# Patient Record
Sex: Female | Born: 1960 | Race: White | Hispanic: No | Marital: Married | Smoking: Never smoker
Health system: Southern US, Community
[De-identification: ages and names within clinical notes are randomized; demographics above are authoritative.]

## PROBLEM LIST (undated history)

## (undated) DIAGNOSIS — H409 Unspecified glaucoma: Secondary | ICD-10-CM

## (undated) DIAGNOSIS — I493 Ventricular premature depolarization: Secondary | ICD-10-CM

## (undated) HISTORY — DX: Ventricular premature depolarization: I49.3

## (undated) HISTORY — DX: Unspecified glaucoma: H40.9

---

## 1997-02-01 HISTORY — PX: OLECRANON BURSA EXCISION: SUR541

## 1997-02-01 HISTORY — PX: ELBOW SURGERY: SHX618

## 1997-05-22 ENCOUNTER — Emergency Department (HOSPITAL_COMMUNITY): Admission: EM | Admit: 1997-05-22 | Discharge: 1997-05-22 | Payer: Self-pay | Admitting: Emergency Medicine

## 1997-10-09 ENCOUNTER — Ambulatory Visit (HOSPITAL_COMMUNITY): Admission: RE | Admit: 1997-10-09 | Discharge: 1997-10-09 | Payer: Self-pay | Admitting: Orthopedic Surgery

## 1998-02-12 ENCOUNTER — Ambulatory Visit (HOSPITAL_COMMUNITY): Admission: RE | Admit: 1998-02-12 | Discharge: 1998-02-12 | Payer: Self-pay | Admitting: Obstetrics and Gynecology

## 1998-02-12 ENCOUNTER — Encounter: Payer: Self-pay | Admitting: Obstetrics and Gynecology

## 1998-12-22 ENCOUNTER — Other Ambulatory Visit: Admission: RE | Admit: 1998-12-22 | Discharge: 1998-12-22 | Payer: Self-pay | Admitting: Gynecology

## 1999-12-31 ENCOUNTER — Other Ambulatory Visit: Admission: RE | Admit: 1999-12-31 | Discharge: 1999-12-31 | Payer: Self-pay | Admitting: Gynecology

## 2000-12-13 ENCOUNTER — Encounter: Admission: RE | Admit: 2000-12-13 | Discharge: 2000-12-13 | Payer: Self-pay | Admitting: Sports Medicine

## 2001-01-04 ENCOUNTER — Other Ambulatory Visit: Admission: RE | Admit: 2001-01-04 | Discharge: 2001-01-04 | Payer: Self-pay | Admitting: Gynecology

## 2001-01-20 ENCOUNTER — Encounter: Payer: Self-pay | Admitting: Sports Medicine

## 2001-01-20 ENCOUNTER — Encounter: Admission: RE | Admit: 2001-01-20 | Discharge: 2001-01-20 | Payer: Self-pay | Admitting: Sports Medicine

## 2001-01-31 ENCOUNTER — Encounter: Payer: Self-pay | Admitting: Sports Medicine

## 2001-01-31 ENCOUNTER — Encounter: Admission: RE | Admit: 2001-01-31 | Discharge: 2001-01-31 | Payer: Self-pay | Admitting: Sports Medicine

## 2001-02-07 ENCOUNTER — Encounter: Admission: RE | Admit: 2001-02-07 | Discharge: 2001-02-07 | Payer: Self-pay | Admitting: Sports Medicine

## 2001-09-26 ENCOUNTER — Encounter: Admission: RE | Admit: 2001-09-26 | Discharge: 2001-09-26 | Payer: Self-pay | Admitting: Family Medicine

## 2001-11-23 ENCOUNTER — Encounter: Admission: RE | Admit: 2001-11-23 | Discharge: 2001-11-23 | Payer: Self-pay | Admitting: Sports Medicine

## 2001-12-01 ENCOUNTER — Encounter: Admission: RE | Admit: 2001-12-01 | Discharge: 2001-12-01 | Payer: Self-pay | Admitting: Family Medicine

## 2002-01-04 ENCOUNTER — Other Ambulatory Visit: Admission: RE | Admit: 2002-01-04 | Discharge: 2002-01-04 | Payer: Self-pay | Admitting: *Deleted

## 2002-01-05 ENCOUNTER — Encounter: Admission: RE | Admit: 2002-01-05 | Discharge: 2002-01-05 | Payer: Self-pay | Admitting: Family Medicine

## 2002-02-02 ENCOUNTER — Encounter: Admission: RE | Admit: 2002-02-02 | Discharge: 2002-02-02 | Payer: Self-pay | Admitting: Sports Medicine

## 2002-06-15 ENCOUNTER — Encounter: Admission: RE | Admit: 2002-06-15 | Discharge: 2002-06-15 | Payer: Self-pay | Admitting: Family Medicine

## 2003-01-09 ENCOUNTER — Other Ambulatory Visit: Admission: RE | Admit: 2003-01-09 | Discharge: 2003-01-09 | Payer: Self-pay | Admitting: Gynecology

## 2003-02-22 ENCOUNTER — Ambulatory Visit (HOSPITAL_COMMUNITY): Admission: RE | Admit: 2003-02-22 | Discharge: 2003-02-22 | Payer: Self-pay | Admitting: Gynecology

## 2004-01-23 ENCOUNTER — Other Ambulatory Visit: Admission: RE | Admit: 2004-01-23 | Discharge: 2004-01-23 | Payer: Self-pay | Admitting: Gynecology

## 2004-02-20 ENCOUNTER — Ambulatory Visit (HOSPITAL_COMMUNITY): Admission: RE | Admit: 2004-02-20 | Discharge: 2004-02-20 | Payer: Self-pay | Admitting: Gynecology

## 2004-09-25 ENCOUNTER — Ambulatory Visit: Payer: Self-pay | Admitting: Sports Medicine

## 2005-02-04 ENCOUNTER — Other Ambulatory Visit: Admission: RE | Admit: 2005-02-04 | Discharge: 2005-02-04 | Payer: Self-pay | Admitting: Gynecology

## 2005-02-12 ENCOUNTER — Ambulatory Visit: Payer: Self-pay | Admitting: Sports Medicine

## 2005-02-25 ENCOUNTER — Ambulatory Visit (HOSPITAL_COMMUNITY): Admission: RE | Admit: 2005-02-25 | Discharge: 2005-02-25 | Payer: Self-pay | Admitting: Gynecology

## 2006-03-07 ENCOUNTER — Other Ambulatory Visit: Admission: RE | Admit: 2006-03-07 | Discharge: 2006-03-07 | Payer: Self-pay | Admitting: Gynecology

## 2006-04-12 ENCOUNTER — Ambulatory Visit (HOSPITAL_COMMUNITY): Admission: RE | Admit: 2006-04-12 | Discharge: 2006-04-12 | Payer: Self-pay | Admitting: Gynecology

## 2007-03-13 ENCOUNTER — Other Ambulatory Visit: Admission: RE | Admit: 2007-03-13 | Discharge: 2007-03-13 | Payer: Self-pay | Admitting: Gynecology

## 2007-04-11 ENCOUNTER — Ambulatory Visit: Payer: Self-pay | Admitting: Sports Medicine

## 2007-04-11 DIAGNOSIS — M7742 Metatarsalgia, left foot: Secondary | ICD-10-CM

## 2007-04-11 DIAGNOSIS — M7741 Metatarsalgia, right foot: Secondary | ICD-10-CM

## 2007-04-11 DIAGNOSIS — M2170 Unequal limb length (acquired), unspecified site: Secondary | ICD-10-CM

## 2007-04-13 ENCOUNTER — Ambulatory Visit (HOSPITAL_COMMUNITY): Admission: RE | Admit: 2007-04-13 | Discharge: 2007-04-13 | Payer: Self-pay | Admitting: Gynecology

## 2008-01-24 ENCOUNTER — Ambulatory Visit: Payer: Self-pay | Admitting: Sports Medicine

## 2008-01-24 DIAGNOSIS — M25519 Pain in unspecified shoulder: Secondary | ICD-10-CM | POA: Insufficient documentation

## 2008-01-24 DIAGNOSIS — M67919 Unspecified disorder of synovium and tendon, unspecified shoulder: Secondary | ICD-10-CM | POA: Insufficient documentation

## 2008-01-24 DIAGNOSIS — M719 Bursopathy, unspecified: Secondary | ICD-10-CM

## 2008-03-13 ENCOUNTER — Ambulatory Visit: Payer: Self-pay | Admitting: Women's Health

## 2008-03-13 ENCOUNTER — Other Ambulatory Visit: Admission: RE | Admit: 2008-03-13 | Discharge: 2008-03-13 | Payer: Self-pay | Admitting: Gynecology

## 2008-03-13 ENCOUNTER — Encounter: Payer: Self-pay | Admitting: Women's Health

## 2008-04-12 ENCOUNTER — Telehealth: Payer: Self-pay | Admitting: Sports Medicine

## 2009-02-17 ENCOUNTER — Ambulatory Visit (HOSPITAL_COMMUNITY): Admission: RE | Admit: 2009-02-17 | Discharge: 2009-02-17 | Payer: Self-pay | Admitting: Gynecology

## 2009-03-14 ENCOUNTER — Ambulatory Visit: Payer: Self-pay | Admitting: Women's Health

## 2009-03-14 ENCOUNTER — Other Ambulatory Visit: Admission: RE | Admit: 2009-03-14 | Discharge: 2009-03-14 | Payer: Self-pay | Admitting: Gynecology

## 2010-02-26 ENCOUNTER — Other Ambulatory Visit: Payer: Self-pay | Admitting: Gynecology

## 2010-02-26 DIAGNOSIS — Z0000 Encounter for general adult medical examination without abnormal findings: Secondary | ICD-10-CM

## 2010-03-03 ENCOUNTER — Ambulatory Visit (HOSPITAL_COMMUNITY): Admission: RE | Admit: 2010-03-03 | Payer: Self-pay | Source: Home / Self Care | Admitting: Gynecology

## 2010-03-07 ENCOUNTER — Encounter: Payer: Self-pay | Admitting: Gynecology

## 2010-03-10 ENCOUNTER — Encounter (HOSPITAL_COMMUNITY): Payer: Self-pay

## 2010-03-10 ENCOUNTER — Ambulatory Visit (HOSPITAL_COMMUNITY)
Admission: RE | Admit: 2010-03-10 | Discharge: 2010-03-10 | Disposition: A | Discharge status: Home / Self Care | Payer: 59 | Source: Ambulatory Visit | Attending: Gynecology | Admitting: Gynecology

## 2010-03-10 DIAGNOSIS — Z0000 Encounter for general adult medical examination without abnormal findings: Secondary | ICD-10-CM

## 2010-03-10 DIAGNOSIS — Z1231 Encounter for screening mammogram for malignant neoplasm of breast: Secondary | ICD-10-CM

## 2010-03-16 ENCOUNTER — Other Ambulatory Visit (HOSPITAL_COMMUNITY)
Admission: RE | Admit: 2010-03-16 | Discharge: 2010-03-16 | Disposition: A | Discharge status: Home / Self Care | Payer: 59 | Source: Ambulatory Visit | Attending: Gynecology | Admitting: Gynecology

## 2010-03-16 ENCOUNTER — Other Ambulatory Visit: Payer: Self-pay | Admitting: Women's Health

## 2010-03-16 ENCOUNTER — Encounter: Payer: 59 | Admitting: Women's Health

## 2010-03-16 DIAGNOSIS — Z124 Encounter for screening for malignant neoplasm of cervix: Secondary | ICD-10-CM | POA: Insufficient documentation

## 2010-03-16 DIAGNOSIS — Z01419 Encounter for gynecological examination (general) (routine) without abnormal findings: Secondary | ICD-10-CM

## 2010-10-20 ENCOUNTER — Ambulatory Visit (INDEPENDENT_AMBULATORY_CARE_PROVIDER_SITE_OTHER): Payer: 59 | Admitting: Family Medicine

## 2010-10-20 ENCOUNTER — Encounter: Payer: Self-pay | Admitting: Family Medicine

## 2010-10-20 VITALS — BP 135/87 | HR 53 | Temp 98.5°F | Ht 66.0 in | Wt 126.0 lb

## 2010-10-20 DIAGNOSIS — M7602 Gluteal tendinitis, left hip: Secondary | ICD-10-CM

## 2010-10-20 DIAGNOSIS — M76899 Other specified enthesopathies of unspecified lower limb, excluding foot: Secondary | ICD-10-CM

## 2010-10-22 ENCOUNTER — Encounter: Payer: Self-pay | Admitting: Family Medicine

## 2010-10-22 DIAGNOSIS — M7602 Gluteal tendinitis, left hip: Secondary | ICD-10-CM | POA: Insufficient documentation

## 2010-10-22 NOTE — Progress Notes (Signed)
  Subjective:    Patient ID: Gail Ford, female    DOB: 12-Oct-1960, 50 y.o.   MRN: [id number]  PCP: Deboraha Sprang  HPI 50 yo F here for left buttock pain.  Patient reports approximately 1 week ago while doing a workout on the track she developed some soreness in left buttock. No bruising or swelling. No radiation into leg. No numbness or tingling. Pain worsened throughout that day. No has 2/10 pain with walking but no pain while at rest. Not tried any medicines. Tried kinesiotape. Training for a marathon - now on week 8 of training - had previously taken 3 years off. Marathon is scheduled Oct 28th. She has a history of piriformis syndrome and ITBS but this feels different. Also has known leg length discrepancy - has custom orthotics.  History reviewed. No pertinent past medical history.  No current outpatient prescriptions on file prior to visit.    Past Surgical History  Procedure Date  . Elbow surgery 1999    left olecranon ORIF     No Known Allergies  History   Social History  . Marital Status: Married    Spouse Name: N/A    Number of Children: N/A  . Years of Education: N/A   Occupational History  . Not on file.   Social History Main Topics  . Smoking status: Never Smoker   . Smokeless tobacco: Not on file  . Alcohol Use: Not on file  . Drug Use: Not on file  . Sexually Active: Not on file   Other Topics Concern  . Not on file   Social History Narrative  . No narrative on file    Family History  Problem Relation Age of Onset  . Hyperlipidemia Mother   . Diabetes Neg Hx   . Heart attack Neg Hx   . Hypertension Neg Hx   . Sudden death Neg Hx     BP 135/87  Pulse 53  Temp(Src) 98.5 F (36.9 C) (Oral)  Ht 5\' 6"  (1.676 m)  Wt 126 lb (57.153 kg)  BMI 20.34 kg/m2  Review of Systems See HPI above.    Objective:   Physical Exam Gen: NAD Back: No gross deformity, scoliosis. No paraspinal TTP .  No midline or bony TTP. FROM. Strength LEs 5/5  all muscle groups. Negative SLRs. Sensation intact to light touch bilaterally. Negative logroll bilateral hips Negative fabers and piriformis stretches.  L leg: No gross deformity, swelling, or bruising. Mild TTP posterior to greater trochanter and superior to this.  No TTP piriformis, SI joint, over trochanter. FROM with negative logroll hip. Strength 5-/5 with hip abduction, 5/5 with all other motions of hip. NVI distally  MSK u/s:  No evidence of gluteal tear, edema, bursitis, increased neovascularity over her maximum area of pain posterior and superior to greater trochanter.  No bony irregularity, edema over cortex, or neovascularity of femur.    Assessment & Plan:  1. 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  Reassured regarding exam and ultrasound.  Will start walk:jog program, try to avoid hills, stairs.  Tylenol and icing/heat as needed.  Consider compression shorts.  F/u prn.

## 2010-10-22 NOTE — Assessment & Plan Note (Signed)
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  Reassured regarding exam and ultrasound.  Will start walk:jog program, try to avoid hills, stairs.  Tylenol and icing/heat as needed.  Consider compression shorts.  F/u prn.

## 2011-02-02 DIAGNOSIS — I493 Ventricular premature depolarization: Secondary | ICD-10-CM

## 2011-02-02 HISTORY — DX: Ventricular premature depolarization: I49.3

## 2011-03-17 ENCOUNTER — Other Ambulatory Visit: Payer: Self-pay | Admitting: Gynecology

## 2011-03-17 ENCOUNTER — Telehealth: Payer: Self-pay | Admitting: *Deleted

## 2011-03-17 DIAGNOSIS — Z0000 Encounter for general adult medical examination without abnormal findings: Secondary | ICD-10-CM

## 2011-03-17 DIAGNOSIS — Z1231 Encounter for screening mammogram for malignant neoplasm of breast: Secondary | ICD-10-CM

## 2011-03-17 NOTE — Telephone Encounter (Signed)
Pt has her annual scheduled on 2/15 and would like to have her labs drawn prior to having her annual exam. Meaning pt would like to come straight to lab office at 8:30 a.m to get labs then to see you after. She going swimming that am and is aware she must be in fasting state. I offered for her to come day before and get blood work, but she wants to come on Friday. Pt also wanted to make sure she has her Chol., TSH, CBC her cardiologist said that has frequent PVC'S and wants to rule out labs. Please advise

## 2011-03-18 NOTE — Telephone Encounter (Signed)
Orders placed in computer, pt will come to have labs drawn.

## 2011-03-18 NOTE — Telephone Encounter (Signed)
Please call and put in computer- CBC, lipid profile, TSH, glucose, labs to be drawn prior to office visit.  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

## 2011-03-19 ENCOUNTER — Ambulatory Visit (INDEPENDENT_AMBULATORY_CARE_PROVIDER_SITE_OTHER): Payer: 59 | Admitting: Women's Health

## 2011-03-19 ENCOUNTER — Encounter: Payer: Self-pay | Admitting: Women's Health

## 2011-03-19 VITALS — BP 102/70 | Ht 66.0 in | Wt 127.5 lb

## 2011-03-19 DIAGNOSIS — Z01419 Encounter for gynecological examination (general) (routine) without abnormal findings: Secondary | ICD-10-CM

## 2011-03-19 DIAGNOSIS — Z833 Family history of diabetes mellitus: Secondary | ICD-10-CM

## 2011-03-19 DIAGNOSIS — Z13220 Encounter for screening for lipoid disorders: Secondary | ICD-10-CM

## 2011-03-19 DIAGNOSIS — IMO0001 Reserved for inherently not codable concepts without codable children: Secondary | ICD-10-CM

## 2011-03-19 DIAGNOSIS — Z309 Encounter for contraceptive management, unspecified: Secondary | ICD-10-CM

## 2011-03-19 DIAGNOSIS — Z0000 Encounter for general adult medical examination without abnormal findings: Secondary | ICD-10-CM

## 2011-03-19 LAB — CBC
Hemoglobin: 13.1 g/dL (ref 12.0–15.0)
MCV: 96 fL (ref 78.0–100.0)
Platelets: 280 10*3/uL (ref 150–400)
RBC: 3.97 MIL/uL (ref 3.87–5.11)
WBC: 8 10*3/uL (ref 4.0–10.5)

## 2011-03-19 LAB — URINALYSIS W MICROSCOPIC + REFLEX CULTURE
Bilirubin Urine: NEGATIVE
Glucose, UA: NEGATIVE mg/dL
Specific Gravity, Urine: <1.005 — ABNORMAL LOW (ref 1.005–1.030)
Urobilinogen, UA: 0.2 mg/dL (ref 0.0–1.0)
pH: 7 (ref 5.0–8.0)

## 2011-03-19 LAB — LIPID PANEL
HDL: 63 mg/dL (ref >39)
Total CHOL/HDL Ratio: 2.6 Ratio

## 2011-03-19 LAB — TSH: TSH: 1.209 u[IU]/mL (ref 0.350–4.500)

## 2011-03-19 MED ORDER — NORETHINDRONE ACET-ETHINYL EST 1-20 MG-MCG PO TABS: 1.0000 | ORAL_TABLET | Freq: Every day | ORAL | Status: DC

## 2011-03-19 NOTE — Progress Notes (Signed)
Gail Ford Dec 07, 1960 [id number]    History:    The patient presents for annual exam. Rare cycle on Loestrin 1/20. History of normal Paps and mammograms. 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 Had Tdap vaccine in 2012.   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   ROS:  A  ROS was performed and pertinent positives and negatives are included in the history.  Exam:  Filed Vitals:   03/19/11 0846  BP: 102/70    General appearance:  Normal Head/Neck:  Normal, without cervical or supraclavicular adenopathy. Thyroid:  Symmetrical, normal in size, without palpable masses or nodularity. Respiratory  Effort:  Normal  Auscultation:  Clear without wheezing or rhonchi Cardiovascular  Auscultation:  Regular rate, without rubs, murmurs or gallops  Edema/varicosities:  Not grossly evident Abdominal  Soft,nontender, without masses, guarding or rebound.  Liver/spleen:  No organomegaly noted  Hernia:  None appreciated  Skin  Inspection:  Grossly normal  Palpation:  Grossly normal Neurologic/psychiatric  Orientation:  Normal with appropriate conversation.  Mood/affect:  Normal  Genitourinary    Breasts: Examined lying and sitting.     Right: Without masses, retractions, discharge or axillary adenopathy.     Left: Without masses, retractions, discharge or axillary adenopathy.   Inguinal/mons:  Normal without inguinal adenopathy  External genitalia:  Normal  BUS/Urethra/Skene's glands:  Normal  Bladder:  Normal  Vagina:  Normal  Cervix:  Normal  Uterus:   normal in size, shape and contour.  Midline and mobile  Adnexa/parametria:     Rt: Without masses or tenderness.   Lt: Without masses or tenderness.  Anus and perineum: Normal  Digital rectal exam: Normal sphincter tone without palpated masses or  tenderness  Assessment/Plan:  51 y.o. MWF G2P2 for annual exam without complaint.  Normal GYN exam on Loestrin 1/20 PVCs/no treatment  Plan: Loestrin 1/20 prescription, proper use, slight risk for blood clots and strokes reviewed. Denies any menopausal symptoms. SBE's, annual mammogram, calcium rich diet, continue healthy lifestyle encouraged. CBC, glucose, lipid profile, TSH, UA.   Harrington Challenger WHNP, 10:13 AM 03/19/2011

## 2011-04-14 ENCOUNTER — Ambulatory Visit (HOSPITAL_COMMUNITY)
Admission: RE | Admit: 2011-04-14 | Discharge: 2011-04-14 | Disposition: A | Discharge status: Home / Self Care | Payer: 59 | Source: Ambulatory Visit | Attending: Gynecology | Admitting: Gynecology

## 2011-04-14 DIAGNOSIS — Z1231 Encounter for screening mammogram for malignant neoplasm of breast: Secondary | ICD-10-CM | POA: Insufficient documentation

## 2012-03-20 ENCOUNTER — Encounter: Payer: Self-pay | Admitting: Women's Health

## 2012-03-20 ENCOUNTER — Ambulatory Visit (INDEPENDENT_AMBULATORY_CARE_PROVIDER_SITE_OTHER): Payer: 59 | Admitting: Women's Health

## 2012-03-20 VITALS — BP 108/70 | Ht 65.75 in | Wt 130.0 lb

## 2012-03-20 DIAGNOSIS — Z309 Encounter for contraceptive management, unspecified: Secondary | ICD-10-CM

## 2012-03-20 DIAGNOSIS — Z01419 Encounter for gynecological examination (general) (routine) without abnormal findings: Secondary | ICD-10-CM

## 2012-03-20 DIAGNOSIS — IMO0001 Reserved for inherently not codable concepts without codable children: Secondary | ICD-10-CM

## 2012-03-20 LAB — CBC WITH DIFFERENTIAL/PLATELET
Basophils Relative: 1 % (ref 0–1)
Eosinophils Absolute: 0.1 10*3/uL (ref 0.0–0.7)
HCT: 38.7 % (ref 36.0–46.0)
Hemoglobin: 13.2 g/dL (ref 12.0–15.0)
MCH: 32.7 pg (ref 26.0–34.0)
MCHC: 34.1 g/dL (ref 30.0–36.0)
Monocytes Absolute: 0.4 10*3/uL (ref 0.1–1.0)
Monocytes Relative: 9 % (ref 3–12)
Neutrophils Relative %: 46 % (ref 43–77)

## 2012-03-20 MED ORDER — NORETHINDRONE ACET-ETHINYL EST 1-20 MG-MCG PO TABS: 1.0000 | ORAL_TABLET | Freq: Every day | ORAL | Status: DC

## 2012-03-20 NOTE — Progress Notes (Signed)
Gail Ford 09/16/1960 [id number]    History:    The patient presents for annual exam.  Light or no menstrual bleeding on Loestrin 1/20. History of normal Paps and mammograms. History of PVCs with negative cardiac workup on no medications. Tdap Vaccine 2012.   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   ROS:  A  ROS was performed and pertinent positives and negatives are included in the history.  Exam:  Filed Vitals:   03/20/12 0803  BP: 108/70    General appearance:  Normal Head/Neck:  Normal, without cervical or supraclavicular adenopathy. Thyroid:  Symmetrical, normal in size, without palpable masses or nodularity. Respiratory  Effort:  Normal  Auscultation:  Clear without wheezing or rhonchi Cardiovascular  Auscultation:  Regular rate, without rubs, murmurs or gallops  Edema/varicosities:  Not grossly evident Abdominal  Soft,nontender, without masses, guarding or rebound.  Liver/spleen:  No organomegaly noted  Hernia:  None appreciated  Skin  Inspection:  Grossly normal  Palpation:  Grossly normal Neurologic/psychiatric  Orientation:  Normal with appropriate conversation.  Mood/affect:  Normal  Genitourinary    Breasts: Examined lying and sitting.     Right: Without masses, retractions, discharge or axillary adenopathy.     Left: Without masses, retractions, discharge or axillary adenopathy.   Inguinal/mons:  Normal without inguinal adenopathy  External genitalia:  Normal  BUS/Urethra/Skene's glands:  Normal  Bladder:  Normal  Vagina:  Normal  Cervix:  Normal  Uterus:   normal in size, shape and contour.  Midline and mobile  Adnexa/parametria:     Rt: Without masses or tenderness.   Lt: Without masses or tenderness.  Anus and perineum: Normal  Digital rectal exam: Normal sphincter tone without  palpated masses or tenderness  Assessment/Plan:  52 y.o. MWF G3P2 for annual exam with no complaints.  Light/no bleeding on Loestrin 1/20  Plan: SBE's, continue annual mammogram, calcium rich diet, vitamin D 1000 daily, continue great exercise regimen/triathalons. Loestrin 1/20 prescription, proper use, slight risk for blood clots and strokes. Instructed to come during placebo week next year check FSH. CBC, UA, Pap normal 2012, reviewed new screening guidelines. Excellent lipid panel 2013.    Harrington Challenger WHNP, 9:10 AM 03/20/2012

## 2012-03-20 NOTE — Patient Instructions (Addendum)

## 2012-03-21 LAB — URINALYSIS W MICROSCOPIC + REFLEX CULTURE
Bilirubin Urine: NEGATIVE
Glucose, UA: NEGATIVE mg/dL
Hgb urine dipstick: NEGATIVE
Ketones, ur: NEGATIVE mg/dL
Protein, ur: NEGATIVE mg/dL

## 2012-03-24 ENCOUNTER — Other Ambulatory Visit: Payer: Self-pay | Admitting: Women's Health

## 2012-12-07 ENCOUNTER — Other Ambulatory Visit: Payer: Self-pay

## 2013-01-22 ENCOUNTER — Ambulatory Visit (INDEPENDENT_AMBULATORY_CARE_PROVIDER_SITE_OTHER): Payer: 59 | Admitting: Sports Medicine

## 2013-01-22 ENCOUNTER — Encounter: Payer: Self-pay | Admitting: Sports Medicine

## 2013-01-22 VITALS — BP 127/78 | HR 50 | Ht 66.0 in | Wt 128.0 lb

## 2013-01-22 DIAGNOSIS — M25512 Pain in left shoulder: Secondary | ICD-10-CM

## 2013-01-22 DIAGNOSIS — M25519 Pain in unspecified shoulder: Secondary | ICD-10-CM

## 2013-01-22 MED ORDER — NITROGLYCERIN 0.2 MG/HR TD PT24: MEDICATED_PATCH | TRANSDERMAL | Status: DC

## 2013-01-22 NOTE — Patient Instructions (Addendum)
Please start 1/4 patch on left shoulder daily  Nitroglycerin Protocol   Apply 1/4 nitroglycerin patch to affected area daily.  Change position of patch within the affected area every 24 hours.  You may experience a headache during the first 1-2 weeks of using the patch, these should subside.  If you experience headaches after beginning nitroglycerin patch treatment, you may take your preferred over the counter pain reliever.  Another side effect of the nitroglycerin patch is skin irritation or rash related to patch adhesive.  Please notify our office if you develop more severe headaches or rash, and stop the patch.  Tendon healing with nitroglycerin patch may require 12 to 24 weeks depending on the extent of injury.  Men should not use if taking Viagra, Cialis, or Levitra.   Do not use if you have migraines or rosacea.   Please do suggested exercises daily:  Bicep curls and shoulder flexion  Spokes of the Wheel exercise  Please follow up in March   Avoid activities that are painful  Thank you for seeing [place] today!

## 2013-01-22 NOTE — Assessment & Plan Note (Signed)
The patient has had some right-sided shoulder issues in the past but today is primarily left  On examination she has some impingement testing that is positive along with the bicipital tendon test  Ultrasound suggests bicipital tendinopathy but the rotator cuff actually looked pretty normal  Trial with specific home rehabilitation for her biceps and rotator cuff  Trial with nitroglycerin protocol  Recheck in 2 months

## 2013-01-22 NOTE — Progress Notes (Signed)
   Subjective:    Patient ID: Gail Ford, female    DOB: 1960/03/29, 52 y.o.   MRN: [id number]  HPI  Pt presents to clinic for evaluation of lt shoulder pain which has been bothering her since May 2014.  She treated shoulder conservatively with ice, ibuprofen and home exercises during the triathlon season.   Has not swam since Iron[place] on 10/29/12.  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  She wakes up at night if lt shoulder gets in uncomfortable position. Bringing arm across body and forward flexion are most painful.   Review of Systems     Objective:   Physical Exam  athletic female in no acute distress  Lt shoulder exam:  Arc not significantly painful  Speed's positive, yergason's negative Empty can weak on lt, but not painful Hawkins weak and mildly painful Neer's weak IR and ER strong No scapular winging  MSK ultrasound  left shoulder The rotator cuff tendons all appeared normal The bicipital tendon showed a small amount of fluid near its insertion at the rotator cuff The a.c. joint showed only minimal change Bursa was not swollen     Assessment & Plan:

## 2013-03-28 ENCOUNTER — Ambulatory Visit (INDEPENDENT_AMBULATORY_CARE_PROVIDER_SITE_OTHER): Payer: 59 | Admitting: Women's Health

## 2013-03-28 ENCOUNTER — Other Ambulatory Visit (HOSPITAL_COMMUNITY)
Admission: RE | Admit: 2013-03-28 | Discharge: 2013-03-28 | Disposition: A | Discharge status: Home / Self Care | Payer: 59 | Source: Ambulatory Visit | Attending: Gynecology | Admitting: Gynecology

## 2013-03-28 ENCOUNTER — Other Ambulatory Visit: Payer: Self-pay | Admitting: *Deleted

## 2013-03-28 ENCOUNTER — Encounter: Payer: Self-pay | Admitting: Women's Health

## 2013-03-28 VITALS — BP 120/74 | Ht 65.0 in | Wt 129.0 lb

## 2013-03-28 DIAGNOSIS — Z833 Family history of diabetes mellitus: Secondary | ICD-10-CM

## 2013-03-28 DIAGNOSIS — IMO0001 Reserved for inherently not codable concepts without codable children: Secondary | ICD-10-CM

## 2013-03-28 DIAGNOSIS — Z01419 Encounter for gynecological examination (general) (routine) without abnormal findings: Secondary | ICD-10-CM | POA: Insufficient documentation

## 2013-03-28 DIAGNOSIS — Z309 Encounter for contraceptive management, unspecified: Secondary | ICD-10-CM

## 2013-03-28 LAB — CBC WITH DIFFERENTIAL/PLATELET
BASOS ABS: 0 10*3/uL (ref 0.0–0.1)
BASOS PCT: 0 % (ref 0–1)
EOS PCT: 1 % (ref 0–5)
Eosinophils Absolute: 0.1 10*3/uL (ref 0.0–0.7)
HEMATOCRIT: 40.2 % (ref 36.0–46.0)
Hemoglobin: 13.9 g/dL (ref 12.0–15.0)
Lymphocytes Relative: 23 % (ref 12–46)
Lymphs Abs: 1.9 10*3/uL (ref 0.7–4.0)
MCH: 32.7 pg (ref 26.0–34.0)
MCHC: 34.6 g/dL (ref 30.0–36.0)
MCV: 94.6 fL (ref 78.0–100.0)
MONO ABS: 0.5 10*3/uL (ref 0.1–1.0)
Monocytes Relative: 6 % (ref 3–12)
NEUTROS ABS: 5.8 10*3/uL (ref 1.7–7.7)
Neutrophils Relative %: 70 % (ref 43–77)
PLATELETS: 392 10*3/uL (ref 150–400)
RBC: 4.25 MIL/uL (ref 3.87–5.11)
RDW: 13.3 % (ref 11.5–15.5)
WBC: 8.3 10*3/uL (ref 4.0–10.5)

## 2013-03-28 LAB — GLUCOSE, RANDOM: Glucose, Bld: 87 mg/dL (ref 70–99)

## 2013-03-28 MED ORDER — NORETHINDRONE ACET-ETHINYL EST 1-20 MG-MCG PO TABS
1.0000 | ORAL_TABLET | Freq: Every day | ORAL | Status: DC
Start: 2013-03-28 — End: 2014-03-29

## 2013-03-28 MED ORDER — NORETHINDRONE ACET-ETHINYL EST 1-20 MG-MCG PO TABS: 1.0000 | ORAL_TABLET | Freq: Every day | ORAL | Status: DC

## 2013-03-28 NOTE — Patient Instructions (Signed)
Health Recommendations for Postmenopausal Women Respected and ongoing research has looked at the most common causes of death, disability, and poor quality of life in postmenopausal women. The causes include heart disease, diseases of blood vessels, diabetes, depression, cancer, and bone loss (osteoporosis). Many things can be done to help lower the chances of developing these and other common problems: CARDIOVASCULAR DISEASE Heart Disease: A heart attack is a medical emergency. Know the signs and symptoms of a heart attack. Below are things women can do to reduce their risk for heart disease.   Do not smoke. If you smoke, quit.  Aim for a healthy weight. Being overweight causes many preventable deaths. Eat a healthy and balanced diet and drink an adequate amount of liquids.  Get moving. Make a commitment to be more physically active. Aim for 30 minutes of activity on most, if not all days of the week.  Eat for heart health. Choose a diet that is low in saturated fat and cholesterol and eliminate trans fat. Include whole grains, vegetables, and fruits. Read and understand the labels on food containers before buying.  Know your numbers. Ask your caregiver to check your blood pressure, cholesterol (total, HDL, LDL, triglycerides) and blood glucose. Work with your caregiver on improving your entire clinical picture.  High blood pressure. Limit or stop your table salt intake (try salt substitute and food seasonings). Avoid salty foods and drinks. Read labels on food containers before buying. Eating well and exercising can help control high blood pressure. STROKE  Stroke is a medical emergency. Stroke may be the result of a blood clot in a blood vessel in the brain or by a brain hemorrhage (bleeding). Know the signs and symptoms of a stroke. To lower the risk of developing a stroke:  Avoid fatty foods.  Quit smoking.  Control your diabetes, blood pressure, and irregular heart rate. THROMBOPHLEBITIS  (BLOOD CLOT) OF THE LEG  Becoming overweight and leading a stationary lifestyle may also contribute to developing blood clots. Controlling your diet and exercising will help lower the risk of developing blood clots. CANCER SCREENING  Breast Cancer: Take steps to reduce your risk of breast cancer.  You should practice "breast self-awareness." This means understanding the normal appearance and feel of your breasts and should include breast self-examination. Any changes detected, no matter how small, should be reported to your caregiver.  After age 40, you should have a clinical breast exam (CBE) every year.  Starting at age 40, you should consider having a mammogram (breast X-ray) every year.  If you have a family history of breast cancer, talk to your caregiver about genetic screening.  If you are at high risk for breast cancer, talk to your caregiver about having an MRI and a mammogram every year.  Intestinal or Stomach Cancer: Tests to consider are a rectal exam, fecal occult blood, sigmoidoscopy, and colonoscopy. Women who are high risk may need to be screened at an earlier age and more often.  Cervical Cancer:  Beginning at age 30, you should have a Pap test every 3 years as long as the past 3 Pap tests have been normal.  If you have had past treatment for cervical cancer or a condition that could lead to cancer, you need Pap tests and screening for cancer for at least 20 years after your treatment.  If you had a hysterectomy for a problem that was not cancer or a condition that could lead to cancer, then you no longer need Pap tests.    If you are between ages 65 and 70, and you have had normal Pap tests going back 10 years, you no longer need Pap tests.  If Pap tests have been discontinued, risk factors (such as a new sexual partner) need to be reassessed to determine if screening should be resumed.  Some medical problems can increase the chance of getting cervical cancer. In these  cases, your caregiver may recommend more frequent screening and Pap tests.  Uterine Cancer: If you have vaginal bleeding after reaching menopause, you should notify your caregiver.  Ovarian cancer: Other than yearly pelvic exams, there are no reliable tests available to screen for ovarian cancer at this time except for yearly pelvic exams.  Lung Cancer: Yearly chest X-rays can detect lung cancer and should be done on high risk women, such as cigarette smokers and women with chronic lung disease (emphysema).  Skin Cancer: A complete body skin exam should be done at your yearly examination. Avoid overexposure to the sun and ultraviolet light lamps. Use a strong sun block cream when in the sun. All of these things are important in lowering the risk of skin cancer. MENOPAUSE Menopause Symptoms: Hormone therapy products are effective for treating symptoms associated with menopause:  Moderate to severe hot flashes.  Night sweats.  Mood swings.  Headaches.  Tiredness.  Loss of sex drive.  Insomnia.  Other symptoms. Hormone replacement carries certain risks, especially in older women. Women who use or are thinking about using estrogen or estrogen with progestin treatments should discuss that with their caregiver. Your caregiver will help you understand the benefits and risks. The ideal dose of hormone replacement therapy is not known. The Food and Drug Administration (FDA) has concluded that hormone therapy should be used only at the lowest doses and for the shortest amount of time to reach treatment goals.  OSTEOPOROSIS Protecting Against Bone Loss and Preventing Fracture: If you use hormone therapy for prevention of bone loss (osteoporosis), the risks for bone loss must outweigh the risk of the therapy. Ask your caregiver about other medications known to be safe and effective for preventing bone loss and fractures. To guard against bone loss or fractures, the following is recommended:  If  you are less than age 50, take 1000 mg of calcium and at least 600 mg of Vitamin D per day.  If you are greater than age 50 but less than age 70, take 1200 mg of calcium and at least 600 mg of Vitamin D per day.  If you are greater than age 70, take 1200 mg of calcium and at least 800 mg of Vitamin D per day. Smoking and excessive alcohol intake increases the risk of osteoporosis. Eat foods rich in calcium and vitamin D and do weight bearing exercises several times a week as your caregiver suggests. DIABETES Diabetes Melitus: If you have Type I or Type 2 diabetes, you should keep your blood sugar under control with diet, exercise and recommended medication. Avoid too many sweets, starchy and fatty foods. Being overweight can make control more difficult. COGNITION AND MEMORY Cognition and Memory: Menopausal hormone therapy is not recommended for the prevention of cognitive disorders such as Alzheimer's disease or memory loss.  DEPRESSION  Depression may occur at any age, but is common in elderly women. The reasons may be because of physical, medical, social (loneliness), or financial problems and needs. If you are experiencing depression because of medical problems and control of symptoms, talk to your caregiver about this. Physical activity and   exercise may help with mood and sleep. Community and volunteer involvement may help your sense of value and worth. If you have depression and you feel that the problem is getting worse or becoming severe, talk to your caregiver about treatment options that are best for you. ACCIDENTS  Accidents are common and can be serious in the elderly woman. Prepare your house to prevent accidents. Eliminate throw rugs, place hand bars in the bath, shower and toilet areas. Avoid wearing high heeled shoes or walking on wet, snowy, and icy areas. Limit or stop driving if you have vision or hearing problems, or you feel you are unsteady with you movements and  reflexes. HEPATITIS C Hepatitis C is a type of viral infection affecting the liver. It is spread mainly through contact with blood from an infected person. It can be treated, but if left untreated, it can lead to severe liver damage over years. Many people who are infected do not know that the virus is in their blood. If you are a "baby-boomer", it is recommended that you have one screening test for Hepatitis C. IMMUNIZATIONS  Several immunizations are important to consider having during your senior years, including:   Tetanus, diptheria, and pertussis booster shot.  Influenza every year before the flu season begins.  Pneumonia vaccine.  Shingles vaccine.  Others as indicated based on your specific needs. Talk to your caregiver about these. Document Released: 03/12/2005 Document Revised: 01/05/2012 Document Reviewed: 11/06/2007 Exit[employer] Patient Information 2014 [employer].  

## 2013-03-28 NOTE — Progress Notes (Signed)
Gail Ford 1960/11/26 [id number]    History:    Presents for annual exam.  Light monthly cycle on Loestrin 1/20. Normal Pap and mammogram history. History of PVCs with a negative cardiology workup 2013. Excellent lipid panel. Does triathlons.  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  ROS:  A  ROS was performed and pertinent positives and negatives are included.  Exam:  Filed Vitals:   03/28/13 0756  BP: 120/74    General appearance:  Normal Thyroid:  Symmetrical, normal in size, without palpable masses or nodularity. Respiratory  Auscultation:  Clear without wheezing or rhonchi Cardiovascular  Auscultation:  Regular rate, without rubs, murmurs or gallops  Edema/varicosities:  Not grossly evident Abdominal  Soft,nontender, without masses, guarding or rebound.  Liver/spleen:  No organomegaly noted  Hernia:  None appreciated  Skin  Inspection:  Grossly normal   Breasts: Examined lying and sitting.     Right: Without masses, retractions, discharge or axillary adenopathy.     Left: Without masses, retractions, discharge or axillary adenopathy. Gentitourinary   Inguinal/mons:  Normal without inguinal adenopathy  External genitalia:  Normal  BUS/Urethra/Skene's glands:  Normal  Vagina:  Normal  Cervix:  Normal  Uterus:  normal in size, shape and contour.  Midline and mobile  Adnexa/parametria:     Rt: Without masses or tenderness.   Lt: Without masses or tenderness.  Anus and perineum: Normal  Digital rectal exam: Normal sphincter tone without palpated masses or tenderness  Assessment/Plan:  52 y.o.MWF G2P2  for annual exam with no complaints.  Normal GYN exam on Loestrin   Plan: Loestrin 1/20 prescription, proper use, slight risk for blood  clots and strokes reviewed. Will check FSH at next annual exam during placebo week. Denies any menopausal symptoms at this time. SBE's, overdue for mammogram, 3D tomography reviewed and encouraged history of dense breasts. Continue active lifestyle with regular exercise and healthy diet. CBC, glucose, UA, Pap. Pap normal to/2012, new screening guidelines reviewed. Instructed to schedule screening colonoscopy.   Gail ChallengerYOUNG,Gail Ford J [facility] - LivermoreWHNP, 9:14 AM 03/28/2013

## 2013-03-29 LAB — URINALYSIS W MICROSCOPIC + REFLEX CULTURE
BACTERIA UA: NONE SEEN
Bilirubin Urine: NEGATIVE
CASTS: NONE SEEN
CRYSTALS: NONE SEEN
GLUCOSE, UA: NEGATIVE mg/dL
HGB URINE DIPSTICK: NEGATIVE
KETONES UR: NEGATIVE mg/dL
Leukocytes, UA: NEGATIVE
NITRITE: NEGATIVE
PH: 6.5 (ref 5.0–8.0)
Protein, ur: NEGATIVE mg/dL
SPECIFIC GRAVITY, URINE: 1.007 (ref 1.005–1.030)
Squamous Epithelial / LPF: NONE SEEN
Urobilinogen, UA: 0.2 mg/dL (ref 0.0–1.0)

## 2013-04-12 ENCOUNTER — Ambulatory Visit: Payer: 59 | Admitting: Sports Medicine

## 2013-06-04 ENCOUNTER — Other Ambulatory Visit: Payer: Self-pay | Admitting: Women's Health

## 2013-06-04 DIAGNOSIS — Z1231 Encounter for screening mammogram for malignant neoplasm of breast: Secondary | ICD-10-CM

## 2013-06-20 ENCOUNTER — Ambulatory Visit (HOSPITAL_COMMUNITY)
Admission: RE | Admit: 2013-06-20 | Discharge: 2013-06-20 | Disposition: A | Discharge status: Home / Self Care | Payer: 59 | Source: Ambulatory Visit | Attending: Women's Health | Admitting: Women's Health

## 2013-06-20 DIAGNOSIS — Z1231 Encounter for screening mammogram for malignant neoplasm of breast: Secondary | ICD-10-CM | POA: Insufficient documentation

## 2013-06-21 ENCOUNTER — Encounter: Payer: Self-pay | Admitting: Women's Health

## 2013-10-22 ENCOUNTER — Encounter: Payer: Self-pay | Admitting: General Surgery

## 2013-12-03 ENCOUNTER — Encounter: Payer: Self-pay | Admitting: General Surgery

## 2014-03-29 ENCOUNTER — Encounter: Payer: Self-pay | Admitting: Women's Health

## 2014-03-29 ENCOUNTER — Ambulatory Visit (INDEPENDENT_AMBULATORY_CARE_PROVIDER_SITE_OTHER): Payer: 59 | Admitting: Women's Health

## 2014-03-29 VITALS — BP 130/76 | Ht 65.5 in | Wt 129.0 lb

## 2014-03-29 DIAGNOSIS — Z3041 Encounter for surveillance of contraceptive pills: Secondary | ICD-10-CM

## 2014-03-29 DIAGNOSIS — N912 Amenorrhea, unspecified: Secondary | ICD-10-CM

## 2014-03-29 DIAGNOSIS — Z13220 Encounter for screening for lipoid disorders: Secondary | ICD-10-CM

## 2014-03-29 LAB — COMPREHENSIVE METABOLIC PANEL
ALK PHOS: 35 U/L — AB (ref 39–117)
ALT: 14 U/L (ref 0–35)
AST: 18 U/L (ref 0–37)
Albumin: 4.2 g/dL (ref 3.5–5.2)
BUN: 20 mg/dL (ref 6–23)
CHLORIDE: 101 meq/L (ref 96–112)
CO2: 29 mEq/L (ref 19–32)
Calcium: 9.4 mg/dL (ref 8.4–10.5)
Creat: 0.9 mg/dL (ref 0.50–1.10)
Glucose, Bld: 86 mg/dL (ref 70–99)
POTASSIUM: 4.2 meq/L (ref 3.5–5.3)
Sodium: 137 mEq/L (ref 135–145)
Total Bilirubin: 1.6 mg/dL — ABNORMAL HIGH (ref 0.2–1.2)
Total Protein: 7.2 g/dL (ref 6.0–8.3)

## 2014-03-29 LAB — LIPID PANEL
CHOL/HDL RATIO: 3.3 ratio
Cholesterol: 223 mg/dL — ABNORMAL HIGH (ref 0–200)
HDL: 68 mg/dL (ref >=46)
LDL Cholesterol: 146 mg/dL — ABNORMAL HIGH (ref 0–99)
Triglycerides: 47 mg/dL (ref <150)
VLDL: 9 mg/dL (ref 0–40)

## 2014-03-29 LAB — CBC WITH DIFFERENTIAL/PLATELET
Basophils Absolute: 0 10*3/uL (ref 0.0–0.1)
Basophils Relative: 1 % (ref 0–1)
EOS ABS: 0 10*3/uL (ref 0.0–0.7)
Eosinophils Relative: 1 % (ref 0–5)
HEMATOCRIT: 41.1 % (ref 36.0–46.0)
Hemoglobin: 14 g/dL (ref 12.0–15.0)
LYMPHS PCT: 43 % (ref 12–46)
Lymphs Abs: 2.1 10*3/uL (ref 0.7–4.0)
MCH: 33 pg (ref 26.0–34.0)
MCHC: 34.1 g/dL (ref 30.0–36.0)
MCV: 96.9 fL (ref 78.0–100.0)
MONO ABS: 0.3 10*3/uL (ref 0.1–1.0)
MONOS PCT: 7 % (ref 3–12)
MPV: 9.3 fL (ref 8.6–12.4)
NEUTROS ABS: 2.4 10*3/uL (ref 1.7–7.7)
Neutrophils Relative %: 48 % (ref 43–77)
Platelets: 316 10*3/uL (ref 150–400)
RBC: 4.24 MIL/uL (ref 3.87–5.11)
RDW: 13.5 % (ref 11.5–15.5)
WBC: 4.9 10*3/uL (ref 4.0–10.5)

## 2014-03-29 LAB — FOLLICLE STIMULATING HORMONE: FSH: 57.5 m[IU]/mL

## 2014-03-29 MED ORDER — NORETHINDRONE ACET-ETHINYL EST 1-20 MG-MCG PO TABS: 1.0000 | ORAL_TABLET | Freq: Every day | ORAL | Status: DC

## 2014-03-29 NOTE — Patient Instructions (Signed)

## 2014-03-29 NOTE — Progress Notes (Signed)
Gail BeardSusan M Ford [birth year] [id number]    History:    Presents for annual exam.  Light spotting on Loestrin. Normal Pap and mammogram history. 2013 had PVCs with a negative workup from cardiologist. Does triathlons. 2015 reports negative colonoscopy.  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 Mother hypertension.  ROS:  A ROS was performed and pertinent positives and negatives are included.  Exam:  Filed Vitals:   03/29/14 0757  BP: 130/76    General appearance:  Normal Thyroid:  Symmetrical, normal in size, without palpable masses or nodularity. Respiratory  Auscultation:  Clear without wheezing or rhonchi Cardiovascular  Auscultation:  Regular rate, without rubs, murmurs or gallops  Edema/varicosities:  Not grossly evident Abdominal  Soft,nontender, without masses, guarding or rebound.  Liver/spleen:  No organomegaly noted  Hernia:  None appreciated  Skin  Inspection:  Grossly normal   Breasts: Examined lying and sitting.     Right: Without masses, retractions, discharge or axillary adenopathy.     Left: Without masses, retractions, discharge or axillary adenopathy. Gentitourinary   Inguinal/mons:  Normal without inguinal adenopathy  External genitalia:  Normal  BUS/Urethra/Skene's glands:  Normal  Vagina:  +1 cystocele  Cervix:  Normal  Uterus:   normal in size, shape and contour.  Midline and mobile  Adnexa/parametria:     Rt: Without masses or tenderness.   Lt: Without masses or tenderness.  Anus and perineum: Normal  Digital rectal exam: Normal sphincter tone without palpated masses or tenderness  Assessment/Plan:  53 y.o.MWF G2P2  for annual exam with complaint of urinary/bowel urgency only with intense training/running, increased forgetfulness, occasional  hot flushes.  Light monthly spotting on Loestrin  Plan: FSH, CMP, CBC, lipid panel, vitamin D, UA, Pap normal 2015, screening guidelines reviewed. SBE's, continue annual 3-D screening mammogram, history of dense breasts. Continue regular exercise, currently training for an iron man triathlon. Discussed physical therapy for perineum, will investigate. Continue calcium rich diet, vitamin D 1000 daily encouraged. Loestrin 1/20 prescription, proper use given and reviewed, [facility]. Menopause reviewed, encourage counseling for home stressors.    Harrington ChallengerYOUNG,Refugio Vandevoorde J [facility] LLCWHNP, 12:37 PM 03/29/2014

## 2014-03-30 LAB — VITAMIN D 25 HYDROXY (VIT D DEFICIENCY, FRACTURES): Vit D, 25-Hydroxy: 42 ng/mL (ref 30–100)

## 2014-06-13 ENCOUNTER — Other Ambulatory Visit: Payer: Self-pay | Admitting: Women's Health

## 2014-06-13 DIAGNOSIS — Z1231 Encounter for screening mammogram for malignant neoplasm of breast: Secondary | ICD-10-CM

## 2014-06-24 ENCOUNTER — Ambulatory Visit (HOSPITAL_COMMUNITY)
Admission: RE | Admit: 2014-06-24 | Discharge: 2014-06-24 | Disposition: A | Discharge status: Home / Self Care | Payer: 59 | Source: Ambulatory Visit | Attending: Women's Health | Admitting: Women's Health

## 2014-06-24 DIAGNOSIS — Z1231 Encounter for screening mammogram for malignant neoplasm of breast: Secondary | ICD-10-CM | POA: Diagnosis not present

## 2014-06-26 ENCOUNTER — Other Ambulatory Visit: Payer: Self-pay | Admitting: Women's Health

## 2014-06-26 DIAGNOSIS — R928 Other abnormal and inconclusive findings on diagnostic imaging of breast: Secondary | ICD-10-CM

## 2014-07-10 ENCOUNTER — Ambulatory Visit
Admission: RE | Admit: 2014-07-10 | Discharge: 2014-07-10 | Disposition: A | Discharge status: Home / Self Care | Payer: 59 | Source: Ambulatory Visit | Attending: Women's Health | Admitting: Women's Health

## 2014-07-10 ENCOUNTER — Encounter: Payer: Self-pay | Admitting: Women's Health

## 2014-07-10 DIAGNOSIS — R928 Other abnormal and inconclusive findings on diagnostic imaging of breast: Secondary | ICD-10-CM

## 2014-11-27 ENCOUNTER — Encounter: Payer: Self-pay | Admitting: Family Medicine

## 2014-11-27 ENCOUNTER — Ambulatory Visit (INDEPENDENT_AMBULATORY_CARE_PROVIDER_SITE_OTHER): Payer: 59 | Admitting: Family Medicine

## 2014-11-27 VITALS — BP 118/60 | HR 52 | Ht 65.0 in | Wt 129.0 lb

## 2014-11-27 DIAGNOSIS — R269 Unspecified abnormalities of gait and mobility: Secondary | ICD-10-CM

## 2014-11-27 NOTE — Assessment & Plan Note (Signed)
Patient was fitted for a standard, cushioned, semi-rigid orthotic. The orthotic was heated and afterward the patient stood on the orthotic blank positioned on the orthotic stand. The patient was positioned in subtalar neutral position and 10 degrees of ankle dorsiflexion in a weight bearing stance. After completion of molding, a stable base was applied to the orthotic blank. The blank was ground to a stable position for weight bearing. Size: 7 Base: Blue EVA Additional Posting and Padding: None The patient ambulated these, and they were very comfortable.  I spent 40 minutes with this patient, greater than 50% was face-to-face time counseling regarding the below diagnosis.  Reevaluated her gait after this and significant stabilization of pronation of both feet.

## 2014-11-27 NOTE — Progress Notes (Signed)
   Subjective:    Patient ID: Iona BeardSusan M Zatarain, female    DOB: [birth year], 54 y.o.   MRN: [id number]  HPI  Patient presents today for orthotics for Both feet. 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 Neurovascular intact bilateral     Assessment & Plan:

## 2015-04-02 ENCOUNTER — Encounter: Payer: Self-pay | Admitting: Women's Health

## 2015-04-02 ENCOUNTER — Ambulatory Visit (INDEPENDENT_AMBULATORY_CARE_PROVIDER_SITE_OTHER): Payer: 59 | Admitting: Women's Health

## 2015-04-02 VITALS — BP 128/80 | Ht 65.0 in | Wt 126.0 lb

## 2015-04-02 DIAGNOSIS — N951 Menopausal and female climacteric states: Secondary | ICD-10-CM

## 2015-04-02 DIAGNOSIS — Z01419 Encounter for gynecological examination (general) (routine) without abnormal findings: Secondary | ICD-10-CM

## 2015-04-02 DIAGNOSIS — N952 Postmenopausal atrophic vaginitis: Secondary | ICD-10-CM

## 2015-04-02 LAB — CBC WITH DIFFERENTIAL/PLATELET
BASOS ABS: 0.1 10*3/uL (ref 0.0–0.1)
Basophils Relative: 1 % (ref 0–1)
EOS PCT: 1 % (ref 0–5)
Eosinophils Absolute: 0.1 10*3/uL (ref 0.0–0.7)
HCT: 40.2 % (ref 36.0–46.0)
Hemoglobin: 13.7 g/dL (ref 12.0–15.0)
LYMPHS ABS: 2.4 10*3/uL (ref 0.7–4.0)
LYMPHS PCT: 46 % (ref 12–46)
MCH: 32.9 pg (ref 26.0–34.0)
MCHC: 34.1 g/dL (ref 30.0–36.0)
MCV: 96.6 fL (ref 78.0–100.0)
MONO ABS: 0.4 10*3/uL (ref 0.1–1.0)
MPV: 9.5 fL (ref 8.6–12.4)
Monocytes Relative: 7 % (ref 3–12)
NEUTROS ABS: 2.3 10*3/uL (ref 1.7–7.7)
Neutrophils Relative %: 45 % (ref 43–77)
Platelets: 278 10*3/uL (ref 150–400)
RBC: 4.16 MIL/uL (ref 3.87–5.11)
RDW: 13.2 % (ref 11.5–15.5)
WBC: 5.2 10*3/uL (ref 4.0–10.5)

## 2015-04-02 LAB — COMPREHENSIVE METABOLIC PANEL
ALT: 13 U/L (ref 6–29)
AST: 21 U/L (ref 10–35)
Albumin: 4.4 g/dL (ref 3.6–5.1)
Alkaline Phosphatase: 41 U/L (ref 33–130)
BILIRUBIN TOTAL: 1.6 mg/dL — AB (ref 0.2–1.2)
BUN: 21 mg/dL (ref 7–25)
CO2: 27 mmol/L (ref 20–31)
CREATININE: 1 mg/dL (ref 0.50–1.05)
Calcium: 8.9 mg/dL (ref 8.6–10.4)
Chloride: 100 mmol/L (ref 98–110)
GLUCOSE: 90 mg/dL (ref 65–99)
Potassium: 4.4 mmol/L (ref 3.5–5.3)
SODIUM: 136 mmol/L (ref 135–146)
Total Protein: 7.3 g/dL (ref 6.1–8.1)

## 2015-04-02 MED ORDER — ESTRADIOL 10 MCG VA TABS: ORAL_TABLET | VAGINAL | Status: DC

## 2015-04-02 NOTE — Patient Instructions (Signed)

## 2015-04-02 NOTE — Progress Notes (Signed)
Gail Ford Jun 15, 1960 [id number]    History:    Presents for annual exam.  Amenorrheic on Loestrin with occasional hot flushes at night. 2016 FSH 57 has continued on Loestrin past year. Normal Pap history, 2016 normal mammogram after ultrasound. 2015 negative colonoscopy.  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 1 son graduating as an Art gallery manager going to work for [employer]  moving to [place]. [name] has a 38-year-old son 50% custody.  ROS:  A ROS was performed and pertinent positives and negatives are included.  Exam:  Filed Vitals:   04/02/15 0806  BP: 128/80    General appearance:  Normal Thyroid:  Symmetrical, normal in size, without palpable masses or nodularity. Respiratory  Auscultation:  Clear without wheezing or rhonchi Cardiovascular  Auscultation:  Regular rate, without rubs, murmurs or gallops  Edema/varicosities:  Not grossly evident Abdominal  Soft,nontender, without masses, guarding or rebound.  Liver/spleen:  No organomegaly noted  Hernia:  None appreciated  Skin  Inspection:  Grossly normal   Breasts: Examined lying and sitting.     Right: Without masses, retractions, discharge or axillary adenopathy.     Left: Without masses, retractions, discharge or axillary adenopathy. Gentitourinary   Inguinal/mons:  Normal without inguinal adenopathy  External genitalia:  Normal  BUS/Urethra/Skene's glands:  Normal  Vagina:  Normal +1 cystocele  Cervix:  Normal  Uterus:   normal in size, shape and contour.  Midline and mobile  Adnexa/parametria:     Rt: Without masses or tenderness.   Lt: Without masses or tenderness.  Anus and perineum: Normal  Digital rectal exam: Normal sphincter tone without palpated masses or tenderness  Assessment/Plan:  55 y.o. MWF G2 P2 for annual exam with rare hot flushes, urinary and bowel urgency with  running.  Minimal Menopausal symptoms - Bowel and bladder urgency with running  Plan: Options reviewed will try Vagifem one applicator at bedtime for 2 weeks and then twice weekly to see if that will help with urgency. Instructed to call if no relief of symptoms. SBE's, continue annual 3-D screening mammogram history of dense breasts, continue healthy lifestyle of regular exercise (triatholons), healthy diet, calcium rich foods, vitamin D supplement. Will stop Loestrin 1/20 after current pack, instructed to call if menopausal symptoms of hot flushes increase or become problematic. CBC, CMP, FSH, UA, Pap with HR HPV typing, new screening guidelines reviewed.  Harrington Challenger [facility], 9:09 AM 04/02/2015

## 2015-04-03 ENCOUNTER — Encounter: Payer: Self-pay | Admitting: Women's Health

## 2015-04-03 LAB — URINALYSIS W MICROSCOPIC + REFLEX CULTURE
Bacteria, UA: NONE SEEN [HPF]
Bilirubin Urine: NEGATIVE
Casts: NONE SEEN [LPF]
Crystals: NONE SEEN [HPF]
Glucose, UA: NEGATIVE
Ketones, ur: NEGATIVE
Leukocytes, UA: NEGATIVE
NITRITE: NEGATIVE
PROTEIN: NEGATIVE
SQUAMOUS EPITHELIAL / LPF: NONE SEEN [HPF] (ref <=5)
Specific Gravity, Urine: 1.017 (ref 1.001–1.035)
WBC, UA: NONE SEEN WBC/HPF (ref <=5)
YEAST: NONE SEEN [HPF]
pH: 6.5 (ref 5.0–8.0)

## 2015-04-03 LAB — PAP IG AND HPV HIGH-RISK: HPV DNA High Risk: NOT DETECTED

## 2015-04-03 LAB — FOLLICLE STIMULATING HORMONE: FSH: 48 m[IU]/mL

## 2015-04-04 LAB — URINE CULTURE

## 2015-07-15 ENCOUNTER — Encounter: Payer: Self-pay | Admitting: Sports Medicine

## 2015-07-15 ENCOUNTER — Ambulatory Visit (INDEPENDENT_AMBULATORY_CARE_PROVIDER_SITE_OTHER): Payer: 59 | Admitting: Sports Medicine

## 2015-07-15 VITALS — BP 128/78 | Ht 65.0 in | Wt 127.0 lb

## 2015-07-15 DIAGNOSIS — M7741 Metatarsalgia, right foot: Secondary | ICD-10-CM

## 2015-07-15 DIAGNOSIS — M7742 Metatarsalgia, left foot: Secondary | ICD-10-CM | POA: Diagnosis not present

## 2015-07-15 DIAGNOSIS — R269 Unspecified abnormalities of gait and mobility: Secondary | ICD-10-CM

## 2015-07-15 DIAGNOSIS — M2170 Unequal limb length (acquired), unspecified site: Secondary | ICD-10-CM | POA: Diagnosis not present

## 2015-07-15 NOTE — Assessment & Plan Note (Signed)
Able to neutralize gait with orthotic changes

## 2015-07-15 NOTE — Progress Notes (Signed)
Patient ID: Gail Ford, female   DOB: Jan 31, 1961, 55 y.o.   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  Had this pain several years ago and was fitted with custom orthotics with improvement, but having pain again now.  Is "burning, pressure" pain that only comes on when about 3-5 miles into her runs and will go away later in the run, does not bother her at rest.  6-8/10 when it comes on but with lateral motions of foot can be 10/10.   Pain worse when new custom orthotics made in January 2017.  New orthotics did not have L lift (to correct limb length discrepancy) that a prior pair did and did not initially have as much forefoot cushion.  Came back to have them adjusted and was not given liftr but was given thick bilateral forefoot support.  Pt is worried because she is changing her gait because of this pain, walking more on the outside of her foot.    Also notes heel pain on the same side, started more recently although not able to say exactly when, not worst first thing in the morning, quickly comes and goes.       ROS: Gen: - fevers/chills MSK: - except as in HPI/ no other joint pain Neuro: + change in gait No numbness over feet  PE:  BP 128/78 mmHg  Ht  (1.651 m)  Wt 127 lb (57.607 kg)  BMI 21.13 kg/m2 Gen: 55 yo female sitting one exam table in NAD HEENT: normocephalic, atraumatic Pulm: normal work of breathing MSK:  RLE: inspection of foot reveals L leg slightly shorter than R,( 1 cm) otherwise normal foot architecture on inspection, full active ROM except L great toe extension (also limited on passive ROM), + TTP over medial border of inferior aspect of calcaneus (over plantar fascia), non-TTP over  2nd/3rd metatarsal heads, strength and sensation grossly intact/ distal forefoot pad is where pain localized Gait/run: gait unhindered but walks somewhat on outside edge of L foot, running form is normal  Imaging:  MSK-US of LLE and RLE performed: 2nd and 3rd metatarsal heads and plantar fascia visualized on L side.  2nd and 3rd metatarsal heads without obvious bony deformity or soft tissue swelling, bilateral plantar fascia measuring 0.47cm thick near calcaneus, L plantar fascia measuring 0.24cm at midfoot    A/P: Gail Ford is an otherwise healthy 55 yo female presenting with metatarsalgia and arch strain. Leg length inequality  Metatarsalgia Pt with prior hx of this, well controlled previously, no signs of structural damage on [place] -- Provided new medium thickness metatarsal pads bilaterally to old and newer pairs of existing custom orthotics -- Counseled patient that this should help since they worked in the past  -- Provided more cushion to shoe per below  L arch strain Pt does not have plantar fasciitis based on US findings, but likely does not have enough arch support cushioning in newer orthotics -- Placed bilateral heel lift tapered on left in existing orthotics -- Advised to do calf/achilles stretches/lifts to help with pain/mobility -- F/U if pain not corrected  Follow-up PRN  Gail Ford, MS4  Agree with asessment.  Note gait  was very level once orthotics corrected.  Sterling BigKB Fields, MD

## 2015-07-15 NOTE — Assessment & Plan Note (Signed)
The newer orthotics did not correct leg length and were neutral drop  Both seem less comfortable to patient  Add leg length correction to left side This helps increase the drop  Try these changes over the next month and cont if good

## 2015-07-15 NOTE — Assessment & Plan Note (Signed)
I believe the correction for this is not right and shifting too much pressure to distal MT fat pad Cut back on forefoot pad Replace MT pads  This felt less painful after completion

## 2015-08-11 ENCOUNTER — Other Ambulatory Visit: Payer: Self-pay | Admitting: Women's Health

## 2015-08-11 DIAGNOSIS — Z1231 Encounter for screening mammogram for malignant neoplasm of breast: Secondary | ICD-10-CM

## 2015-08-18 ENCOUNTER — Ambulatory Visit
Admission: RE | Admit: 2015-08-18 | Discharge: 2015-08-18 | Disposition: A | Discharge status: Home / Self Care | Payer: 59 | Source: Ambulatory Visit | Attending: Women's Health | Admitting: Women's Health

## 2015-08-18 DIAGNOSIS — Z1231 Encounter for screening mammogram for malignant neoplasm of breast: Secondary | ICD-10-CM

## 2015-08-19 ENCOUNTER — Encounter: Payer: Self-pay | Admitting: Women's Health

## 2016-04-02 ENCOUNTER — Ambulatory Visit (INDEPENDENT_AMBULATORY_CARE_PROVIDER_SITE_OTHER): Payer: BLUE CROSS/BLUE SHIELD | Admitting: Women's Health

## 2016-04-02 ENCOUNTER — Encounter: Payer: Self-pay | Admitting: Women's Health

## 2016-04-02 VITALS — BP 106/68 | Ht 65.5 in | Wt 126.0 lb

## 2016-04-02 DIAGNOSIS — Z01419 Encounter for gynecological examination (general) (routine) without abnormal findings: Secondary | ICD-10-CM

## 2016-04-02 DIAGNOSIS — Z13220 Encounter for screening for lipoid disorders: Secondary | ICD-10-CM | POA: Diagnosis not present

## 2016-04-02 DIAGNOSIS — R5383 Other fatigue: Secondary | ICD-10-CM

## 2016-04-02 LAB — COMPREHENSIVE METABOLIC PANEL
ALBUMIN: 4.5 g/dL (ref 3.6–5.1)
ALK PHOS: 48 U/L (ref 33–130)
ALT: 14 U/L (ref 6–29)
AST: 20 U/L (ref 10–35)
BUN: 21 mg/dL (ref 7–25)
CALCIUM: 9.4 mg/dL (ref 8.6–10.4)
CO2: 29 mmol/L (ref 20–31)
Chloride: 103 mmol/L (ref 98–110)
Creat: 0.99 mg/dL (ref 0.50–1.05)
GLUCOSE: 88 mg/dL (ref 65–99)
POTASSIUM: 4.4 mmol/L (ref 3.5–5.3)
Sodium: 139 mmol/L (ref 135–146)
TOTAL PROTEIN: 7.2 g/dL (ref 6.1–8.1)
Total Bilirubin: 1.3 mg/dL — ABNORMAL HIGH (ref 0.2–1.2)

## 2016-04-02 LAB — CBC WITH DIFFERENTIAL/PLATELET
BASOS PCT: 0 %
Basophils Absolute: 0 cells/uL (ref 0–200)
EOS ABS: 63 {cells}/uL (ref 15–500)
Eosinophils Relative: 1 %
HEMATOCRIT: 40.7 % (ref 35.0–45.0)
HEMOGLOBIN: 13.6 g/dL (ref 11.7–15.5)
LYMPHS ABS: 2646 {cells}/uL (ref 850–3900)
Lymphocytes Relative: 42 %
MCH: 32.2 pg (ref 27.0–33.0)
MCHC: 33.4 g/dL (ref 32.0–36.0)
MCV: 96.4 fL (ref 80.0–100.0)
MPV: 9.5 fL (ref 7.5–12.5)
Monocytes Absolute: 504 cells/uL (ref 200–950)
Monocytes Relative: 8 %
Neutro Abs: 3087 cells/uL (ref 1500–7800)
Neutrophils Relative %: 49 %
Platelets: 274 10*3/uL (ref 140–400)
RBC: 4.22 MIL/uL (ref 3.80–5.10)
RDW: 13.3 % (ref 11.0–15.0)
WBC: 6.3 10*3/uL (ref 3.8–10.8)

## 2016-04-02 LAB — LIPID PANEL
CHOL/HDL RATIO: 2.6 ratio (ref <5.0)
CHOLESTEROL: 200 mg/dL — AB (ref <200)
HDL: 77 mg/dL (ref >50)
LDL Cholesterol: 114 mg/dL — ABNORMAL HIGH (ref <100)
TRIGLYCERIDES: 46 mg/dL (ref <150)
VLDL: 9 mg/dL (ref <30)

## 2016-04-02 LAB — TSH: TSH: 0.84 mIU/L

## 2016-04-02 NOTE — Progress Notes (Signed)
Gail BeardSusan M Doten Aug 15, 1960 [id number]    History:    Presents for annual exam.  Amenorrheic since July, had been on Loestrin previously, stopped last year after elevated FSH. Normal Pap and mammogram history. Was having some problems with vaginal dryness that has resolved.  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  Husband recently diagnosed with thyroid cancer.  1 son Art gallery managerengineer, doing well, [name] has 50% custody of 417-year-old son. 54 year old dog illl, will need to put down soon.  ROS:  A ROS was performed and pertinent positives and negatives are included.  Exam:  Vitals:   04/02/16 0756  BP: 106/68  Weight: 126 lb (57.2 kg)  Height: 5' 5.5" (1.664 m)   Body mass index is 20.65 kg/m.   General appearance:  Normal Thyroid:  Symmetrical, normal in size, without palpable masses or nodularity. Respiratory  Auscultation:  Clear without wheezing or rhonchi Cardiovascular  Auscultation:  Regular rate, without rubs, murmurs or gallops  Edema/varicosities:  Not grossly evident Abdominal  Soft,nontender, without masses, guarding or rebound.  Liver/spleen:  No organomegaly noted  Hernia:  None appreciated  Skin  Inspection:  Grossly normal   Breasts: Examined lying and sitting.     Right: Without masses, retractions, discharge or axillary adenopathy.     Left: Without masses, retractions, discharge or axillary adenopathy. Gentitourinary   Inguinal/mons:  Normal without inguinal adenopathy  External genitalia:  Normal  BUS/Urethra/Skene's glands:  Normal  Vagina:  Normal  Cervix:  Normal  Uterus:   normal in size, shape and contour.  Midline and mobile  Adnexa/parametria:     Rt: Without masses or tenderness.   Lt: Without masses or tenderness.  Anus and perineum: Normal  Digital rectal exam: Normal sphincter tone without palpated masses or  tenderness  Assessment/Plan:  56 y.o. MWF G3P2 for annual exam complaint of increased fatigue.    One cycle in the past year Situational stress/husband thyroid cancer,  beloved dog sick  Plan: SBE's, continue annual 3-D screening mammogram history of dense breast. Continue healthy lifestyle of regular exercise, calcium rich diet, healthy diet, vitamin D 2000 daily encouraged.  Menopause reviewed, instructed to call if any further bleeding. CBC, TSH, lipid panel, CMP, Pap normal with negative HR HPV 2017, new screening guidelines reviewed.    Harrington ChallengerYOUNG,[facility]WHNP, 8:56 AM 04/02/2016

## 2016-04-02 NOTE — Patient Instructions (Signed)
Health Maintenance for Postmenopausal Women Menopause is a normal process in which your reproductive ability comes to an end. This process happens gradually over a span of months to years, usually between the ages of 33 and 38. Menopause is complete when you have missed 12 consecutive menstrual periods. It is important to talk with your health care provider about some of the most common conditions that affect postmenopausal women, such as heart disease, cancer, and bone loss (osteoporosis). Adopting a healthy lifestyle and getting preventive care can help to promote your health and wellness. Those actions can also lower your chances of developing some of these common conditions. What should I know about menopause? During menopause, you may experience a number of symptoms, such as:  Moderate-to-severe hot flashes.  Night sweats.  Decrease in sex drive.  Mood swings.  Headaches.  Tiredness.  Irritability.  Memory problems.  Insomnia. Choosing to treat or not to treat menopausal changes is an individual decision that you make with your health care provider. What should I know about hormone replacement therapy and supplements? Hormone therapy products are effective for treating symptoms that are associated with menopause, such as hot flashes and night sweats. Hormone replacement carries certain risks, especially as you become older. If you are thinking about using estrogen or estrogen with progestin treatments, discuss the benefits and risks with your health care provider. What should I know about heart disease and stroke? Heart disease, heart attack, and stroke become more likely as you age. This may be due, in part, to the hormonal changes that your body experiences during menopause. These can affect how your body processes dietary fats, triglycerides, and cholesterol. Heart attack and stroke are both medical emergencies. There are many things that you can do to help prevent heart disease  and stroke:  Have your blood pressure checked at least every 1-2 years. High blood pressure causes heart disease and increases the risk of stroke.  If you are 48-61 years old, ask your health care provider if you should take aspirin to prevent a heart attack or a stroke.  Do not use any tobacco products, including cigarettes, chewing tobacco, or electronic cigarettes. If you need help quitting, ask your health care provider.  It is important to eat a healthy diet and maintain a healthy weight.  Be sure to include plenty of vegetables, fruits, low-fat dairy products, and lean protein.  Avoid eating foods that are high in solid fats, added sugars, or salt (sodium).  Get regular exercise. This is one of the most important things that you can do for your health.  Try to exercise for at least 150 minutes each week. The type of exercise that you do should increase your heart rate and make you sweat. This is known as moderate-intensity exercise.  Try to do strengthening exercises at least twice each week. Do these in addition to the moderate-intensity exercise.  Know your numbers.Ask your health care provider to check your cholesterol and your blood glucose. Continue to have your blood tested as directed by your health care provider. What should I know about cancer screening? There are several types of cancer. Take the following steps to reduce your risk and to catch any cancer development as early as possible. Breast Cancer  Practice breast self-awareness.  This means understanding how your breasts normally appear and feel.  It also means doing regular breast self-exams. Let your health care provider know about any changes, no matter how small.  If you are 40 or older,  have a clinician do a breast exam (clinical breast exam or CBE) every year. Depending on your age, family history, and medical history, it may be recommended that you also have a yearly breast X-ray (mammogram).  If you  have a family history of breast cancer, talk with your health care provider about genetic screening.  If you are at high risk for breast cancer, talk with your health care provider about having an MRI and a mammogram every year.  Breast cancer (BRCA) gene test is recommended for women who have family members with BRCA-related cancers. Results of the assessment will determine the need for genetic counseling and BRCA1 and for BRCA2 testing. BRCA-related cancers include these types:  Breast. This occurs in males or females.  Ovarian.  Tubal. This may also be called fallopian tube cancer.  Cancer of the abdominal or pelvic lining (peritoneal cancer).  Prostate.  Pancreatic. Cervical, Uterine, and Ovarian Cancer  Your health care provider may recommend that you be screened regularly for cancer of the pelvic organs. These include your ovaries, uterus, and vagina. This screening involves a pelvic exam, which includes checking for microscopic changes to the surface of your cervix (Pap test).  For women ages 21-65, health care providers may recommend a pelvic exam and a Pap test every three years. For women ages 23-65, they may recommend the Pap test and pelvic exam, combined with testing for human papilloma virus (HPV), every five years. Some types of HPV increase your risk of cervical cancer. Testing for HPV may also be done on women of any age who have unclear Pap test results.  Other health care providers may not recommend any screening for nonpregnant women who are considered low risk for pelvic cancer and have no symptoms. Ask your health care provider if a screening pelvic exam is right for you.  If you have had past treatment for cervical cancer or a condition that could lead to cancer, you need Pap tests and screening for cancer for at least 20 years after your treatment. If Pap tests have been discontinued for you, your risk factors (such as having a new sexual partner) need to be reassessed  to determine if you should start having screenings again. Some women have medical problems that increase the chance of getting cervical cancer. In these cases, your health care provider may recommend that you have screening and Pap tests more often.  If you have a family history of uterine cancer or ovarian cancer, talk with your health care provider about genetic screening.  If you have vaginal bleeding after reaching menopause, tell your health care provider.  There are currently no reliable tests available to screen for ovarian cancer. Lung Cancer  Lung cancer screening is recommended for adults 99-83 years old who are at high risk for lung cancer because of a history of smoking. A yearly low-dose CT scan of the lungs is recommended if you:  Currently smoke.  Have a history of at least 30 pack-years of smoking and you currently smoke or have quit within the past 15 years. A pack-year is smoking an average of one pack of cigarettes per day for one year. Yearly screening should:  Continue until it has been 15 years since you quit.  Stop if you develop a health problem that would prevent you from having lung cancer treatment. Colorectal Cancer  This type of cancer can be detected and can often be prevented.  Routine colorectal cancer screening usually begins at age 72 and continues  through age 75.  If you have risk factors for colon cancer, your health care provider may recommend that you be screened at an earlier age.  If you have a family history of colorectal cancer, talk with your health care provider about genetic screening.  Your health care provider may also recommend using home test kits to check for hidden blood in your stool.  A small camera at the end of a tube can be used to examine your colon directly (sigmoidoscopy or colonoscopy). This is done to check for the earliest forms of colorectal cancer.  Direct examination of the colon should be repeated every 5-10 years until  age 75. However, if early forms of precancerous polyps or small growths are found or if you have a family history or genetic risk for colorectal cancer, you may need to be screened more often. Skin Cancer  Check your skin from head to toe regularly.  Monitor any moles. Be sure to tell your health care provider:  About any new moles or changes in moles, especially if there is a change in a mole's shape or color.  If you have a mole that is larger than the size of a pencil eraser.  If any of your family members has a history of skin cancer, especially at a [place]ash[place] Jesus age, talk with your health care provider about genetic screening.  Always use sunscreen. Apply sunscreen liberally and repeatedly throughout the day.  Whenever you are outside, protect yourself by wearing long sleeves, pants, a wide-brimmed hat, and sunglasses. What should I know about osteoporosis? Osteoporosis is a condition in which bone destruction happens more quickly than new bone creation. After menopause, you may be at an increased risk for osteoporosis. To help prevent osteoporosis or the bone fractures that can happen because of osteoporosis, the following is recommended:  If you are 19-50 years old, get at least 1,000 mg of calcium and at least 600 mg of vitamin D per day.  If you are older than age 50 but younger than age 70, get at least 1,200 mg of calcium and at least 600 mg of vitamin D per day.  If you are older than age 70, get at least 1,200 mg of calcium and at least 800 mg of vitamin D per day. Smoking and excessive alcohol intake increase the risk of osteoporosis. Eat foods that are rich in calcium and vitamin D, and do weight-bearing exercises several times each week as directed by your health care provider. What should I know about how menopause affects my mental health? Depression may occur at any age, but it is more common as you become older. Common symptoms of depression include:  Low or sad  mood.  Changes in sleep patterns.  Changes in appetite or eating patterns.  Feeling an overall lack of motivation or enjoyment of activities that you previously enjoyed.  Frequent crying spells. Talk with your health care provider if you think that you are experiencing depression. What should I know about immunizations? It is important that you get and maintain your immunizations. These include:  Tetanus, diphtheria, and pertussis (Tdap) booster vaccine.  Influenza every year before the flu season begins.  Pneumonia vaccine.  Shingles vaccine. Your health care provider may also recommend other immunizations. This information is not intended to replace advice given to you by your health care provider. Make sure you discuss any questions you have with your health care provider. Document Released: 03/12/2005 Document Revised: 08/08/2015 Document Reviewed: 10/22/2014 Elsevier Interactive Patient   Education  2017 [employer]  

## 2016-05-31 ENCOUNTER — Other Ambulatory Visit: Payer: Self-pay | Admitting: Women's Health

## 2016-05-31 DIAGNOSIS — Z1231 Encounter for screening mammogram for malignant neoplasm of breast: Secondary | ICD-10-CM

## 2016-08-19 ENCOUNTER — Ambulatory Visit
Admission: RE | Admit: 2016-08-19 | Discharge: 2016-08-19 | Disposition: A | Discharge status: Home / Self Care | Payer: BLUE CROSS/BLUE SHIELD | Source: Ambulatory Visit | Attending: Women's Health | Admitting: Women's Health

## 2016-08-19 DIAGNOSIS — Z1231 Encounter for screening mammogram for malignant neoplasm of breast: Secondary | ICD-10-CM

## 2016-08-23 ENCOUNTER — Encounter: Payer: Self-pay | Admitting: Women's Health

## 2016-10-20 ENCOUNTER — Telehealth: Payer: Self-pay

## 2016-10-20 NOTE — Telephone Encounter (Signed)
Patient called with 2 questions.  #1 -You recommended a dermatologist several years ago. She can't remember the name and wondered if you could make recommendation again.  #2  She is having hotflashes. They had gone away but have now returned.  She said they are very annoying but not totally disruptive. She wondered how annoying they need to be before enough to treat with hrt?

## 2016-10-20 NOTE — Telephone Encounter (Signed)
Message left, Dr. Emily Filbert phone number given. Will discuss HRT and menopausal symptoms.

## 2016-10-22 NOTE — Telephone Encounter (Signed)
Message left

## 2016-10-25 NOTE — Telephone Encounter (Signed)
TC hot flashes less, will watch for now.

## 2017-06-14 ENCOUNTER — Ambulatory Visit (INDEPENDENT_AMBULATORY_CARE_PROVIDER_SITE_OTHER): Payer: BLUE CROSS/BLUE SHIELD | Admitting: Women's Health

## 2017-06-14 ENCOUNTER — Encounter: Payer: Self-pay | Admitting: Women's Health

## 2017-06-14 VITALS — BP 122/70 | Ht 65.5 in | Wt 130.0 lb

## 2017-06-14 DIAGNOSIS — Z01419 Encounter for gynecological examination (general) (routine) without abnormal findings: Secondary | ICD-10-CM | POA: Diagnosis not present

## 2017-06-14 NOTE — Patient Instructions (Signed)
Health Maintenance for Postmenopausal Women Menopause is a normal process in which your reproductive ability comes to an end. This process happens gradually over a span of months to years, usually between the ages of 22 and 9. Menopause is complete when you have missed 12 consecutive menstrual periods. It is important to talk with your health care provider about some of the most common conditions that affect postmenopausal women, such as heart disease, cancer, and bone loss (osteoporosis). Adopting a healthy lifestyle and getting preventive care can help to promote your health and wellness. Those actions can also lower your chances of developing some of these common conditions. What should I know about menopause? During menopause, you may experience a number of symptoms, such as:  Moderate-to-severe hot flashes.  Night sweats.  Decrease in sex drive.  Mood swings.  Headaches.  Tiredness.  Irritability.  Memory problems.  Insomnia.  Choosing to treat or not to treat menopausal changes is an individual decision that you make with your health care provider. What should I know about hormone replacement therapy and supplements? Hormone therapy products are effective for treating symptoms that are associated with menopause, such as hot flashes and night sweats. Hormone replacement carries certain risks, especially as you become older. If you are thinking about using estrogen or estrogen with progestin treatments, discuss the benefits and risks with your health care provider. What should I know about heart disease and stroke? Heart disease, heart attack, and stroke become more likely as you age. This may be due, in part, to the hormonal changes that your body experiences during menopause. These can affect how your body processes dietary fats, triglycerides, and cholesterol. Heart attack and stroke are both medical emergencies. There are many things that you can do to help prevent heart disease  and stroke:  Have your blood pressure checked at least every 1-2 years. High blood pressure causes heart disease and increases the risk of stroke.  If you are 53-22 years old, ask your health care provider if you should take aspirin to prevent a heart attack or a stroke.  Do not use any tobacco products, including cigarettes, chewing tobacco, or electronic cigarettes. If you need help quitting, ask your health care provider.  It is important to eat a healthy diet and maintain a healthy weight. ? Be sure to include plenty of vegetables, fruits, low-fat dairy products, and lean protein. ? Avoid eating foods that are high in solid fats, added sugars, or salt (sodium).  Get regular exercise. This is one of the most important things that you can do for your health. ? Try to exercise for at least 150 minutes each week. The type of exercise that you do should increase your heart rate and make you sweat. This is known as moderate-intensity exercise. ? Try to do strengthening exercises at least twice each week. Do these in addition to the moderate-intensity exercise.  Know your numbers.Ask your health care provider to check your cholesterol and your blood glucose. Continue to have your blood tested as directed by your health care provider.  What should I know about cancer screening? There are several types of cancer. Take the following steps to reduce your risk and to catch any cancer development as early as possible. Breast Cancer  Practice breast self-awareness. ? This means understanding how your breasts normally appear and feel. ? It also means doing regular breast self-exams. Let your health care provider know about any changes, no matter how small.  If you are 40  or older, have a clinician do a breast exam (clinical breast exam or CBE) every year. Depending on your age, family history, and medical history, it may be recommended that you also have a yearly breast X-ray (mammogram).  If you  have a family history of breast cancer, talk with your health care provider about genetic screening.  If you are at high risk for breast cancer, talk with your health care provider about having an MRI and a mammogram every year.  Breast cancer (BRCA) gene test is recommended for women who have family members with BRCA-related cancers. Results of the assessment will determine the need for genetic counseling and BRCA1 and for BRCA2 testing. BRCA-related cancers include these types: ? Breast. This occurs in males or females. ? Ovarian. ? Tubal. This may also be called fallopian tube cancer. ? Cancer of the abdominal or pelvic lining (peritoneal cancer). ? Prostate. ? Pancreatic.  Cervical, Uterine, and Ovarian Cancer Your health care provider may recommend that you be screened regularly for cancer of the pelvic organs. These include your ovaries, uterus, and vagina. This screening involves a pelvic exam, which includes checking for microscopic changes to the surface of your cervix (Pap test).  For women ages 21-65, health care providers may recommend a pelvic exam and a Pap test every three years. For women ages 79-65, they may recommend the Pap test and pelvic exam, combined with testing for human papilloma virus (HPV), every five years. Some types of HPV increase your risk of cervical cancer. Testing for HPV may also be done on women of any age who have unclear Pap test results.  Other health care providers may not recommend any screening for nonpregnant women who are considered low risk for pelvic cancer and have no symptoms. Ask your health care provider if a screening pelvic exam is right for you.  If you have had past treatment for cervical cancer or a condition that could lead to cancer, you need Pap tests and screening for cancer for at least 20 years after your treatment. If Pap tests have been discontinued for you, your risk factors (such as having a new sexual partner) need to be  reassessed to determine if you should start having screenings again. Some women have medical problems that increase the chance of getting cervical cancer. In these cases, your health care provider may recommend that you have screening and Pap tests more often.  If you have a family history of uterine cancer or ovarian cancer, talk with your health care provider about genetic screening.  If you have vaginal bleeding after reaching menopause, tell your health care provider.  There are currently no reliable tests available to screen for ovarian cancer.  Lung Cancer Lung cancer screening is recommended for adults 69-62 years old who are at high risk for lung cancer because of a history of smoking. A yearly low-dose CT scan of the lungs is recommended if you:  Currently smoke.  Have a history of at least 30 pack-years of smoking and you currently smoke or have quit within the past 15 years. A pack-year is smoking an average of one pack of cigarettes per day for one year.  Yearly screening should:  Continue until it has been 15 years since you quit.  Stop if you develop a health problem that would prevent you from having lung cancer treatment.  Colorectal Cancer  This type of cancer can be detected and can often be prevented.  Routine colorectal cancer screening usually begins at  age 42 and continues through age 45.  If you have risk factors for colon cancer, your health care provider may recommend that you be screened at an earlier age.  If you have a family history of colorectal cancer, talk with your health care provider about genetic screening.  Your health care provider may also recommend using home test kits to check for hidden blood in your stool.  A small camera at the end of a tube can be used to examine your colon directly (sigmoidoscopy or colonoscopy). This is done to check for the earliest forms of colorectal cancer.  Direct examination of the colon should be repeated every  5-10 years until age 71. However, if early forms of precancerous polyps or small growths are found or if you have a family history or genetic risk for colorectal cancer, you may need to be screened more often.  Skin Cancer  Check your skin from head to toe regularly.  Monitor any moles. Be sure to tell your health care provider: ? About any new moles or changes in moles, especially if there is a change in a mole's shape or color. ? If you have a mole that is larger than the size of a pencil eraser.  If any of your family members has a history of skin cancer, especially at a [place] Mcfall age, talk with your health care provider about genetic screening.  Always use sunscreen. Apply sunscreen liberally and repeatedly throughout the day.  Whenever you are outside, protect yourself by wearing long sleeves, pants, a wide-brimmed hat, and sunglasses.  What should I know about osteoporosis? Osteoporosis is a condition in which bone destruction happens more quickly than new bone creation. After menopause, you may be at an increased risk for osteoporosis. To help prevent osteoporosis or the bone fractures that can happen because of osteoporosis, the following is recommended:  If you are 46-71 years old, get at least 1,000 mg of calcium and at least 600 mg of vitamin D per day.  If you are older than age 55 but younger than age 65, get at least 1,200 mg of calcium and at least 600 mg of vitamin D per day.  If you are older than age 54, get at least 1,200 mg of calcium and at least 800 mg of vitamin D per day.  Smoking and excessive alcohol intake increase the risk of osteoporosis. Eat foods that are rich in calcium and vitamin D, and do weight-bearing exercises several times each week as directed by your health care provider. What should I know about how menopause affects my mental health? Depression may occur at any age, but it is more common as you become older. Common symptoms of depression  include:  Low or sad mood.  Changes in sleep patterns.  Changes in appetite or eating patterns.  Feeling an overall lack of motivation or enjoyment of activities that you previously enjoyed.  Frequent crying spells.  Talk with your health care provider if you think that you are experiencing depression. What should I know about immunizations? It is important that you get and maintain your immunizations. These include:  Tetanus, diphtheria, and pertussis (Tdap) booster vaccine.  Influenza every year before the flu season begins.  Pneumonia vaccine.  Shingles vaccine.  Your health care provider may also recommend other immunizations. This information is not intended to replace advice given to you by your health care provider. Make sure you discuss any questions you have with your health care provider. Document Released: 03/12/2005  Document Revised: 08/08/2015 Document Reviewed: 10/22/2014 [employer] Interactive Patient Education  2018 [employer]  

## 2017-06-14 NOTE — Progress Notes (Signed)
Gail Ford 1960-12-18 [id number]    History:    Presents for annual exam.  Postmenopausal on no HRT with no bleeding.  Occasional hot flashes.  Normal Pap and mammogram history.  Negative colonoscopy at age 57.  Continues to do triathlons but states times are not as good as they once were.  Past medical history, past surgical history, family history and social history were all reviewed and documented in the EPIC chart.  Physical therapist.  2 sons one son is an Art gallery manager, second son Gail Ford has 50% custody of his 85-year-old Gail Ford doing much better.  Husband thyroid cancer last year doing better.  ROS:  A ROS was performed and pertinent positives and negatives are included.  Exam:  Vitals:   06/14/17 1601  BP: 122/70  Weight: 130 lb (59 kg)  Height: 5' 5.5" (1.664 m)   Body mass index is 21.3 kg/m.   General appearance:  Normal Thyroid:  Symmetrical, normal in size, without palpable masses or nodularity. Respiratory  Auscultation:  Clear without wheezing or rhonchi Cardiovascular  Auscultation:  Regular rate, without rubs, murmurs or gallops  Edema/varicosities:  Not grossly evident Abdominal  Soft,nontender, without masses, guarding or rebound.  Liver/spleen:  No organomegaly noted  Hernia:  None appreciated  Skin  Inspection:  Grossly normal   Breasts: Examined lying and sitting.     Right: Without masses, retractions, discharge or axillary adenopathy.     Left: Without masses, retractions, discharge or axillary adenopathy. Gentitourinary   Inguinal/mons:  Normal without inguinal adenopathy  External genitalia:  Normal  BUS/Urethra/Skene's glands:  Normal  Vagina: Mild atrophy cervix:  Normal  Uterus:   normal in size, shape and contour.  Midline and mobile  Adnexa/parametria:     Rt: Without masses or tenderness.   Lt: Without masses or tenderness.  Anus and perineum: Normal  Digital rectal exam: Normal sphincter tone without palpated masses or  tenderness  Assessment/Plan:  57 y.o. MWF G3, P2 for annual exam with occasional menopausal symptoms.  Postmenopausal/no HRT/no bleeding  Plan: Menopause reviewed, HRT options reviewed, declines for right now.  SBE's, continue annual screening mammogram, calcium rich foods, vitamin D 2000 daily encouraged.  Encouraged to continue the healthy lifestyle of regular exercise/triathlons and healthy diet.  DEXA next year.  CBC, CMP,.  Pap normal 2017, new screening guidelines reviewed.   Harrington Challenger [facility], 4:47 PM 06/14/2017

## 2017-06-15 ENCOUNTER — Encounter (INDEPENDENT_AMBULATORY_CARE_PROVIDER_SITE_OTHER): Payer: Self-pay

## 2017-06-15 ENCOUNTER — Encounter: Payer: Self-pay | Admitting: Family Medicine

## 2017-06-15 ENCOUNTER — Ambulatory Visit (INDEPENDENT_AMBULATORY_CARE_PROVIDER_SITE_OTHER): Payer: BLUE CROSS/BLUE SHIELD | Admitting: Family Medicine

## 2017-06-15 DIAGNOSIS — S76012A Strain of muscle, fascia and tendon of left hip, initial encounter: Secondary | ICD-10-CM | POA: Diagnosis not present

## 2017-06-15 LAB — COMPREHENSIVE METABOLIC PANEL
AG Ratio: 2 (calc) (ref 1.0–2.5)
ALKALINE PHOSPHATASE (APISO): 55 U/L (ref 33–130)
ALT: 23 U/L (ref 6–29)
AST: 28 U/L (ref 10–35)
Albumin: 4.5 g/dL (ref 3.6–5.1)
BILIRUBIN TOTAL: 1 mg/dL (ref 0.2–1.2)
BUN: 22 mg/dL (ref 7–25)
CALCIUM: 9.4 mg/dL (ref 8.6–10.4)
CO2: 29 mmol/L (ref 20–32)
Chloride: 102 mmol/L (ref 98–110)
Creat: 0.8 mg/dL (ref 0.50–1.05)
GLOBULIN: 2.3 g/dL (ref 1.9–3.7)
Glucose, Bld: 89 mg/dL (ref 65–99)
Potassium: 4.1 mmol/L (ref 3.5–5.3)
Sodium: 139 mmol/L (ref 135–146)
Total Protein: 6.8 g/dL (ref 6.1–8.1)

## 2017-06-15 LAB — CBC WITH DIFFERENTIAL/PLATELET
Basophils Absolute: 28 cells/uL (ref 0–200)
Basophils Relative: 0.4 %
EOS PCT: 1.3 %
Eosinophils Absolute: 91 cells/uL (ref 15–500)
HEMATOCRIT: 37.8 % (ref 35.0–45.0)
Hemoglobin: 13 g/dL (ref 11.7–15.5)
Lymphs Abs: 3059 cells/uL (ref 850–3900)
MCH: 32.7 pg (ref 27.0–33.0)
MCHC: 34.4 g/dL (ref 32.0–36.0)
MCV: 95 fL (ref 80.0–100.0)
MONOS PCT: 9 %
MPV: 10.4 fL (ref 7.5–12.5)
NEUTROS PCT: 45.6 %
Neutro Abs: 3192 cells/uL (ref 1500–7800)
Platelets: 280 10*3/uL (ref 140–400)
RBC: 3.98 10*6/uL (ref 3.80–5.10)
RDW: 12.6 % (ref 11.0–15.0)
Total Lymphocyte: 43.7 %
WBC mixed population: 630 cells/uL (ref 200–950)
WBC: 7 10*3/uL (ref 3.8–10.8)

## 2017-06-15 NOTE — Progress Notes (Signed)
   HPI  CC: Left hamstring pain Patient is here with persistent left-sided "hamstring" pain since September 2018.  Patient is a avid runner/jogger.  She states that during an Iron[place] last fall she began to develop some tightness and discomfort in the left posterior lateral aspect of her glute.  She initially felt that this pain would go away with time and try to manage this on her own.  Unfortunately this discomfort has persisted.  It has gone through periods of improvement and worsening.  She is at the point where she would like to make sure that she is not missing a more serious issue.  New weakness, numbness, or paresthesias.  No bowel or bladder incontinence.  No specific back pain.  No prior injury.  Traumatic: No  Location: Left-sided posterior glute Quality: Aching and tight  Duration: Approximately 8 months Timing: Prolonged exercise and with squatting Improving/Worsening: Unchanged Makes better: Rest, stretching Makes worse: Prolonged exercise, squatting position, prolonged sitting position on a bike. Associated symptoms: none   Previous Interventions Tried: Stretching, hip strengthening, relative rest, ice, heat  Past Injuries: none  Past Surgeries: Noncontributory Smoking: Non-smoker Family Hx: Noncontributory  ROS: Per HPI; in addition no fever, no rash, no additional weakness, no additional numbness, no additional paresthesias, and no additional falls/injury.   Objective: BP 114/70   Ht  (1.651 m)   Wt 128 lb (58.1 kg)   LMP  (LMP Unknown)   BMI 21.30 kg/m  Gen: NAD, well groomed, a/o x3, normal affect.  CV: Well-perfused. Warm.  Resp: Non-labored.  Neuro: Sensation intact throughout. No gross coordination deficits.  Gait: Nonpathologic posture, unremarkable stride without signs of limp or balance issues. Hip, Left: TTP noted at the posterior glutes just superior to the ischial tuberosity. No obvious rash, erythema, ecchymosis, or edema. ROM full in all  directions; Strength 5/5 in IR/ER/Flex/Ext/Abd/Add (relative weakness in hip abduction).  Simultaneous hip extension and abduction against resistance reproduced discomfort. Pelvic alignment unremarkable to inspection and palpation. Standing hip rotation and gait without trendelenburg / unsteadiness. Greater trochanter without tenderness to palpation. No tenderness over piriformis. No SI joint tenderness and normal minimal SI movement. FABER tight but w/o reproduced Sxs.    Assessment and Plan:  Muscle strain of left hip Patient is here with signs and symptoms consistent with a gluteus maximus strain with possible involvement of the insertion of the hamstring.  Symptoms were able to be reproduced with simultaneous hip extension and abduction against resistance.  No radiation of symptoms or back pain.  No red flag symptoms.  Patient is interested in conservative management at this time. -HEP: Focus on hip abductors, eccentric Nordic strengthening, gluteus maximus strengthening. -RICE therapy as needed -Regular stretching. -Follow-up 4 weeks  Next: If symptoms persist or worsen then would begin to consider other possible diagnoses.  Would strongly encourage getting ultrasound at that time.  Would consider lumbar x-rays as I cannot yet rule out neurologic etiology.  Possible consideration for gabapentin if this (neurogenic etiology) seems more likely.   Kathee Delton, MD,MS [facility] Sports Medicine Fellow 06/15/2017 5:59 PM

## 2017-06-15 NOTE — Assessment & Plan Note (Signed)
Patient is here with signs and symptoms consistent with a gluteus maximus strain with possible involvement of the insertion of the hamstring.  Symptoms were able to be reproduced with simultaneous hip extension and abduction against resistance.  No radiation of symptoms or back pain.  No red flag symptoms.  Patient is interested in conservative management at this time. -HEP: Focus on hip abductors, eccentric Nordic strengthening, gluteus maximus strengthening. -RICE therapy as needed -Regular stretching. -Follow-up 4 weeks  Next: If symptoms persist or worsen then would begin to consider other possible diagnoses.  Would strongly encourage getting ultrasound at that time.  Would consider lumbar x-rays as I cannot yet rule out neurologic etiology.  Possible consideration for gabapentin if this (neurogenic etiology) seems more likely.

## 2017-07-13 ENCOUNTER — Ambulatory Visit: Payer: PRIVATE HEALTH INSURANCE | Admitting: Family Medicine

## 2017-07-13 ENCOUNTER — Encounter: Payer: Self-pay | Admitting: Family Medicine

## 2017-07-13 DIAGNOSIS — S76012A Strain of muscle, fascia and tendon of left hip, initial encounter: Secondary | ICD-10-CM | POA: Diagnosis not present

## 2017-07-13 MED ORDER — GABAPENTIN 300 MG PO CAPS: 300.0000 mg | ORAL_CAPSULE | Freq: Every day | ORAL | 1 refills | Status: DC

## 2017-07-13 NOTE — Progress Notes (Signed)
   HPI  CC: Follow-up left hip/hamstring Patient is here to follow-up regarding her left hamstring discomfort.  At the last visit she was diagnosed with tendinitis at the origin of the hamstrings tendon.  She was given relatively aggressive home exercises and asked to follow-up today.  She states that she has had some significant improvements since last being seen.  She still has some discomfort especially after 2 to 5 miles of running.  She is pleased with her improvement but states that she still needs to make more improvement before she is satisfied.  She denies any setbacks.  No weakness, numbness, or paresthesias.  Medications/Interventions Tried: HEP, relative rest  See HPI and/or previous note for associated ROS.  Objective: BP 124/86   Ht 5\' 5"  (1.651 m)   Wt 128 lb (58.1 kg)   LMP  (LMP Unknown)   BMI 21.30 kg/m  Gen: NAD, well groomed, a/o x3, normal affect.  CV: Well-perfused. Warm.  Resp: Non-labored.  Neuro: Sensation intact throughout. No gross coordination deficits.  Gait: Nonpathologic posture, unremarkable stride without signs of limp or balance issues. Hip, Left: TTP noted at the posterior glutes just superior to the ischial tuberosity (IMPROVED). No obvious rash, erythema, ecchymosis, or edema. ROM full in all directions; Strength 5/5 (relative weakness in hip abduction improved but still mildly present). Standing hip rotation and gait without trendelenburg / unsteadiness. Greater trochanter without tenderness to palpation. No tenderness over piriformis. No SI joint tenderness and normal minimal SI movement. FABER tight but w/o reproduced Sxs.    Assessment and plan:  Muscle strain of left hip Patient is here to follow-up regarding her hamstring and buttock pain.  Symptoms have improved with more aggressive home exercise regimen. -Continue HEP >> some modifications made today to reduce daily workload to allow patient to continue training for Iron[employer]. -Initiate  gabapentin 300 mg at night >> my hope is that this may reduce any associated neurological component patient may be having.  As I feel this may be contributing to her symptoms that initiate upon certain distances of jogging. -Follow-up 3 to 4 weeks  Of Note >> patient may need new custom orthotics.  This is unrelated to current issue.   Meds ordered this encounter  Medications  . gabapentin (NEURONTIN) 300 MG capsule    Sig: Take 1 capsule (300 mg total) by mouth at bedtime.    Dispense:  30 capsule    Refill:  1     Kathee DeltonIan D Maryela Tapper, MD,MS [facility] Fellow 07/13/2017 7:19 PM

## 2017-07-13 NOTE — Assessment & Plan Note (Signed)
Patient is here to follow-up regarding her hamstring and buttock pain.  Symptoms have improved with more aggressive home exercise regimen. -Continue HEP >> some modifications made today to reduce daily workload to allow patient to continue training for Iron[employer]. -Initiate gabapentin 300 mg at night >> my hope is that this may reduce any associated neurological component patient may be having.  As I feel this may be contributing to her symptoms that initiate upon certain distances of jogging. -Follow-up 3 to 4 weeks  Of Note >> patient may need new custom orthotics.  This is unrelated to current issue.

## 2017-07-21 ENCOUNTER — Other Ambulatory Visit: Payer: Self-pay | Admitting: Women's Health

## 2017-07-21 DIAGNOSIS — Z1231 Encounter for screening mammogram for malignant neoplasm of breast: Secondary | ICD-10-CM

## 2017-08-03 ENCOUNTER — Ambulatory Visit: Payer: PRIVATE HEALTH INSURANCE | Admitting: Family Medicine

## 2017-08-03 ENCOUNTER — Encounter: Payer: Self-pay | Admitting: Family Medicine

## 2017-08-03 VITALS — BP 118/84 | Ht 65.0 in | Wt 128.0 lb

## 2017-08-03 DIAGNOSIS — S76012A Strain of muscle, fascia and tendon of left hip, initial encounter: Secondary | ICD-10-CM

## 2017-08-03 NOTE — Assessment & Plan Note (Signed)
Patient continues to have some nagging tightness/discomfort along the insertion of the hamstring and glutes.  Symptoms have improved with more aggressive home exercise regimen, and time.  We initiated gabapentin at the last visit which seems to have helped as well.  Overall performance has improved, but patient's target has still not been reached. -Continue HEP -Continue gabapentin 300 mg at night. - PT Rx given today >> more thorough assessment of HEP techniques and improving strength.  Also I have asked PT to use iontophoresis on the area of discomfort. -Replaced MT pad with MT cookie today (Rt side). -Follow-up 6 weeks  Of Note >> we have not yet looked into any possible contribution of the lumbar spine to patient's symptoms.  Patient mentioned no discomfort with HEP but consistent symptoms with prolonged jogging.  Patient's relatively persistent symptoms may be related to irritation at the level of the L-spine.  Strong consideration for further radiologic evaluation if symptoms continue to linger.

## 2017-08-03 NOTE — Progress Notes (Signed)
   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  We have been treating her with relatively vigorous physical therapy exercises and most recently added some nighttime gabapentin to her regimen.  She endorses improved performance and pain relief but continued overall tightness and discomfort.  She denies any recent setbacks or injuries.  Pain/discomfort persists along the proximal posterior aspect of the left thigh near the insertion of the hamstring tendon.  Her performance with distance runs have improved since initiating gabapentin as she states she no longer has to stop every 3 miles to "stretch it out".  She denies any weakness, numbness, or paresthesias.  No radiation of pain into her calf or foot.  Patient has her first major competition in approximately 3 to 4 weeks.  She is hoping to make some additional progress prior to this event.  Medications/Interventions Tried: HEP, gabapentin, OTC NSAIDs, Orthotics  See HPI and/or previous note for associated ROS.  Objective: BP 118/84   Ht 5\' 5"  (1.651 m)   Wt 128 lb (58.1 kg)   LMP  (LMP Unknown)   BMI 21.30 kg/m  Gen:NAD, well groomed, a/o x3, normal affect.  YQ:[id number]:Well-perfused. Warm.  Resp:Non-labored.  Neuro:Sensation intact throughout. No gross coordination deficits.  Gait:Nonpathologic posture, unremarkable stride without signs of limp or balance issues. Hip,Left:TTP noted atthe posterior glutes just superior to the ischial tuberosity (IMPROVED).No obvious rash, erythema, ecchymosis, or edema. ROMfull in all directions; Strength 5/5 (relative weakness in hip abduction mostly resolved). Standing hip rotation and gait without trendelenburg / unsteadiness.Greater trochanter without tenderness to palpation.No tenderness over piriformis.No SI joint tenderness and normal minimal SI movement.FABER tight but w/o reproduced  Sxs.   Assessment and plan:  Muscle strain of left hip Patient continues to have some nagging tightness/discomfort along the insertion of the hamstring and glutes.  Symptoms have improved with more aggressive home exercise regimen, and time.  We initiated gabapentin at the last visit which seems to have helped as well.  Overall performance has improved, but patient's target has still not been reached. -Continue HEP -Continue gabapentin 300 mg at night. - PT Rx given today >> more thorough assessment of HEP techniques and improving strength.  Also I have asked PT to use iontophoresis on the area of discomfort. -Replaced MT pad with MT cookie today (Rt side). -Follow-up 6 weeks  Of Note >> we have not yet looked into any possible contribution of the lumbar spine to patient's symptoms.  Patient mentioned no discomfort with HEP but consistent symptoms with prolonged jogging.  Patient's relatively persistent symptoms may be related to irritation at the level of the L-spine.  Strong consideration for further radiologic evaluation if symptoms continue to linger.   Orders Placed This Encounter  Procedures  . Ambulatory referral to Physical Therapy    Referral Priority:   Routine    Referral Type:   Physical Medicine    Referral Reason:   Specialty Services Required    [facility]    Requested Specialty:   Physical Therapy    Number of Visits Requested:   1    Kathee DeltonIan D Steadman Prosperi, MD,MS [facility] Fellow 08/03/2017 6:03 PM

## 2017-08-22 ENCOUNTER — Ambulatory Visit
Admission: RE | Admit: 2017-08-22 | Discharge: 2017-08-22 | Disposition: A | Discharge status: Home / Self Care | Payer: PRIVATE HEALTH INSURANCE | Source: Ambulatory Visit | Attending: Women's Health | Admitting: Women's Health

## 2017-08-22 DIAGNOSIS — Z1231 Encounter for screening mammogram for malignant neoplasm of breast: Secondary | ICD-10-CM

## 2017-08-23 ENCOUNTER — Encounter (INDEPENDENT_AMBULATORY_CARE_PROVIDER_SITE_OTHER): Payer: Self-pay

## 2017-09-14 ENCOUNTER — Ambulatory Visit: Payer: PRIVATE HEALTH INSURANCE | Admitting: Family Medicine

## 2017-09-14 VITALS — BP 98/68 | Ht 65.5 in | Wt 127.0 lb

## 2017-09-14 DIAGNOSIS — M25552 Pain in left hip: Secondary | ICD-10-CM

## 2017-09-14 DIAGNOSIS — G5763 Lesion of plantar nerve, bilateral lower limbs: Secondary | ICD-10-CM | POA: Diagnosis not present

## 2017-09-14 MED ORDER — AMBULATORY NON FORMULARY MEDICATION: 0 refills | Status: DC

## 2017-09-14 MED ORDER — NITROGLYCERIN 0.2 MG/HR TD PT24: MEDICATED_PATCH | TRANSDERMAL | 1 refills | Status: DC

## 2017-09-14 NOTE — Patient Instructions (Signed)
Try nitro patches - 1/4th patch to affected proximal hamstring/glute and change daily. Continue your strengthening and stretching. Can consider dry needling in addition to the manual work. Start iontophoresis as well.  We changed from metatarsal cookie to pad on the right and moved it a little more lateral and forward, changed the pad on the left.

## 2017-09-15 ENCOUNTER — Encounter: Payer: Self-pay | Admitting: Family Medicine

## 2017-09-15 NOTE — Progress Notes (Signed)
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  We have been treating her with relatively vigorous physical therapy exercises and most recently added some nighttime gabapentin to her regimen.  She endorses improved performance and pain relief but continued overall tightness and discomfort.  She denies any recent setbacks or injuries.  Pain/discomfort persists along the proximal posterior aspect of the left thigh near the insertion of the hamstring tendon.  Her performance with distance runs have improved since initiating gabapentin as she states she no longer has to stop every 3 miles to "stretch it out".  She denies any weakness, numbness, or paresthesias.  No radiation of pain into her calf or foot.  Patient has her first major competition in approximately 3 to 4 weeks.  She is hoping to make some additional progress prior to this event.  Medications/Interventions Tried: HEP, gabapentin, OTC NSAIDs, Orthotics  See HPI and/or previous note for associated ROS.  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 She's been able to exercise, stopping to do her stretches and exercises. She's taking gabapentin at nighttime. Doing more functional rehab exercises as the basic rehab doesn't seem to be bothering her including nordic hamstring exercise. Pain currently is about a 1/10. Also with pain of both feet, plantar metatarsal areas, right worse than left. No new skin changes, numbness, injuries.  Past Medical History:  Diagnosis Date  . PVC (premature ventricular contraction) 1-13   SEEING CARDIOLOGIST    Current Outpatient Medications on File Prior to Visit  Medication Sig Dispense Refill  . Multiple Vitamins-Minerals (MULTIVITAMIN PO) Take by mouth.    . gabapentin (NEURONTIN) 300 MG capsule Take 1  capsule (300 mg total) by mouth at bedtime. (Patient not taking: Reported on 09/14/2017) 30 capsule 1   No current facility-administered medications on file prior to visit.     Past Surgical History:  Procedure Laterality Date  . ELBOW SURGERY  1999   left olecranon ORIF   . OLECRANON BURSA EXCISION  1999   LEFT    No Known Allergies  Social History   Socioeconomic History  . Marital status: Married    Spouse name: Not on file  . Number of children: Not on file  . Years of education: Not on file  . Highest education level: Not on file  Occupational History  . Not on file  Social Needs  . Financial resource strain: Not on file  . Food insecurity:    Worry: Not on file    Inability: Not on file  . Transportation needs:    Medical: Not on file    Non-medical: Not on file  Tobacco Use  . Smoking status: Never Smoker  . Smokeless tobacco: Never Used  Substance and Sexual Activity  . Alcohol use: Yes    Alcohol/week: 0.0 standard drinks    Comment: SOCIALLY  . Drug use: No  . Sexual activity: Yes    Comment: 1st intercourse- 24, partners- 1  Lifestyle  . Physical activity:    Days per week: Not on file    Minutes per session: Not on file  . Stress: Not on file  Relationships  . Social connections:    Talks on phone: Not on file    Gets together: Not on file    Attends religious service: Not on  file    Active member of club or organization: Not on file    Attends meetings of clubs or organizations: Not on file    Relationship status: Not on file  . Intimate partner violence:    Fear of current or ex partner: Not on file    Emotionally abused: Not on file    Physically abused: Not on file    Forced sexual activity: Not on file  Other Topics Concern  . Not on file  Social History Narrative  . Not on file    Family History  Problem Relation Age of Onset  . Hyperlipidemia Mother   . Celiac disease Mother   . Diabetes Neg Hx   . Heart attack Neg Hx   .  Hypertension Neg Hx   . Sudden death Neg Hx     BP 98/68   Ht 5' 5.5" (1.664 m)   Wt 127 lb (57.6 kg)   LMP  (LMP Unknown)   BMI 20.81 kg/m   Review of Systems: See HPI above.     Objective:  Physical Exam:  Gen: NAD, comfortable in exam room  Left hip: No deformity. FROM with 5/5 strength including hip flexion, extension, abduction; knee flexion at 30 and 90 degrees. Mild TTP over ischial tuberosity, proximal hamstring tendon.  No other tenderness. NVI distally. Negative fabers, piriformis, fadir. Negative logroll  Bilateral feet/ankles: No gross deformity, swelling, ecchymoses FROM with 5/5 strength. TTP MT heads 2-4 bilaterally, right worse than left. Negative ant drawer and talar tilt.   NV intact distally.   Assessment & Plan:  1. Left hip pain - 2/2 proximal hamstring tendinopathy, gluteal tendinopathy.  Mostly the former based on today's exam.  Continue home exercises, gabapentin.  Add nitro patches - discussed risks of headache, skin irritation.  Consider dry needling.  Start ionto also.  F/u in 6 weeks.  2. Bilateral metatarsalgia - believe metatarsal cookie on right a little too wide for her - support given under 1st and 5th at expense of 2nd-4th - exchanged for new medium metatarsal pad and new one placed on left also.  These felt comfortable and supportive to her.  F/u in 6 weeks.

## 2017-11-28 ENCOUNTER — Other Ambulatory Visit: Payer: Self-pay | Admitting: Women's Health

## 2017-11-28 MED ORDER — PROGESTERONE MICRONIZED 200 MG PO CAPS: ORAL_CAPSULE | ORAL | 3 refills | Status: DC

## 2017-11-28 MED ORDER — ESTRADIOL 0.05 MG/24HR TD PTTW: 1.0000 | MEDICATED_PATCH | TRANSDERMAL | 3 refills | Status: DC

## 2018-04-19 ENCOUNTER — Encounter: Payer: Self-pay | Admitting: Women's Health

## 2018-04-19 NOTE — Telephone Encounter (Signed)
Please provide Rx information for Prozac. Thanks Wavie Hashimi CMA

## 2018-04-20 MED ORDER — FLUOXETINE HCL 10 MG PO CAPS: 10.0000 mg | ORAL_CAPSULE | Freq: Every day | ORAL | 3 refills | Status: DC

## 2018-04-20 NOTE — Telephone Encounter (Signed)
Prescription Prozac 10 mg sent. KW CMA

## 2018-05-12 ENCOUNTER — Other Ambulatory Visit: Payer: Self-pay | Admitting: Women's Health

## 2018-05-17 ENCOUNTER — Encounter: Payer: Self-pay | Admitting: Women's Health

## 2018-05-18 ENCOUNTER — Other Ambulatory Visit: Payer: Self-pay | Admitting: Women's Health

## 2018-05-18 MED ORDER — FLUOXETINE HCL 10 MG PO CAPS: ORAL_CAPSULE | ORAL | 2 refills | Status: DC

## 2018-06-19 ENCOUNTER — Encounter: Payer: Self-pay | Admitting: Women's Health

## 2018-06-19 ENCOUNTER — Other Ambulatory Visit: Payer: Self-pay

## 2018-06-19 ENCOUNTER — Ambulatory Visit (INDEPENDENT_AMBULATORY_CARE_PROVIDER_SITE_OTHER): Payer: PRIVATE HEALTH INSURANCE | Admitting: Women's Health

## 2018-06-19 VITALS — BP 122/78 | Ht 65.0 in | Wt 125.0 lb

## 2018-06-19 DIAGNOSIS — Z1151 Encounter for screening for human papillomavirus (HPV): Secondary | ICD-10-CM | POA: Diagnosis not present

## 2018-06-19 DIAGNOSIS — F419 Anxiety disorder, unspecified: Secondary | ICD-10-CM | POA: Diagnosis not present

## 2018-06-19 DIAGNOSIS — Z01419 Encounter for gynecological examination (general) (routine) without abnormal findings: Secondary | ICD-10-CM

## 2018-06-19 DIAGNOSIS — T385X5D Adverse effect of other estrogens and progestogens, subsequent encounter: Secondary | ICD-10-CM

## 2018-06-19 MED ORDER — ESTRADIOL 0.05 MG/24HR TD PTTW: 1.0000 | MEDICATED_PATCH | TRANSDERMAL | 3 refills | Status: DC

## 2018-06-19 MED ORDER — PROGESTERONE MICRONIZED 200 MG PO CAPS: ORAL_CAPSULE | ORAL | 3 refills | Status: DC

## 2018-06-19 MED ORDER — FLUOXETINE HCL 10 MG PO CAPS: ORAL_CAPSULE | ORAL | 4 refills | Status: DC

## 2018-06-19 NOTE — Patient Instructions (Signed)
Health Maintenance for Postmenopausal Women Menopause is a normal process in which your reproductive ability comes to an end. This process happens gradually over a span of months to years, usually between the ages of 62 and 89. Menopause is complete when you have missed 12 consecutive menstrual periods. It is important to talk with your health care provider about some of the most common conditions that affect postmenopausal women, such as heart disease, cancer, and bone loss (osteoporosis). Adopting a healthy lifestyle and getting preventive care can help to promote your health and wellness. Those actions can also lower your chances of developing some of these common conditions. What should I know about menopause? During menopause, you may experience a number of symptoms, such as:  Moderate-to-severe hot flashes.  Night sweats.  Decrease in sex drive.  Mood swings.  Headaches.  Tiredness.  Irritability.  Memory problems.  Insomnia. Choosing to treat or not to treat menopausal changes is an individual decision that you make with your health care provider. What should I know about hormone replacement therapy and supplements? Hormone therapy products are effective for treating symptoms that are associated with menopause, such as hot flashes and night sweats. Hormone replacement carries certain risks, especially as you become older. If you are thinking about using estrogen or estrogen with progestin treatments, discuss the benefits and risks with your health care provider. What should I know about heart disease and stroke? Heart disease, heart attack, and stroke become more likely as you age. This may be due, in part, to the hormonal changes that your body experiences during menopause. These can affect how your body processes dietary fats, triglycerides, and cholesterol. Heart attack and stroke are both medical emergencies. There are many things that you can do to help prevent heart disease  and stroke:  Have your blood pressure checked at least every 1-2 years. High blood pressure causes heart disease and increases the risk of stroke.  If you are 79-72 years old, ask your health care provider if you should take aspirin to prevent a heart attack or a stroke.  Do not use any tobacco products, including cigarettes, chewing tobacco, or electronic cigarettes. If you need help quitting, ask your health care provider.  It is important to eat a healthy diet and maintain a healthy weight. ? Be sure to include plenty of vegetables, fruits, low-fat dairy products, and lean protein. ? Avoid eating foods that are high in solid fats, added sugars, or salt (sodium).  Get regular exercise. This is one of the most important things that you can do for your health. ? Try to exercise for at least 150 minutes each week. The type of exercise that you do should increase your heart rate and make you sweat. This is known as moderate-intensity exercise. ? Try to do strengthening exercises at least twice each week. Do these in addition to the moderate-intensity exercise.  Know your numbers.Ask your health care provider to check your cholesterol and your blood glucose. Continue to have your blood tested as directed by your health care provider.  What should I know about cancer screening? There are several types of cancer. Take the following steps to reduce your risk and to catch any cancer development as early as possible. Breast Cancer  Practice breast self-awareness. ? This means understanding how your breasts normally appear and feel. ? It also means doing regular breast self-exams. Let your health care provider know about any changes, no matter how small.  If you are 40 or  older, have a clinician do a breast exam (clinical breast exam or CBE) every year. Depending on your age, family history, and medical history, it may be recommended that you also have a yearly breast X-ray (mammogram).  If you  have a family history of breast cancer, talk with your health care provider about genetic screening.  If you are at high risk for breast cancer, talk with your health care provider about having an MRI and a mammogram every year.  Breast cancer (BRCA) gene test is recommended for women who have family members with BRCA-related cancers. Results of the assessment will determine the need for genetic counseling and BRCA1 and for BRCA2 testing. BRCA-related cancers include these types: ? Breast. This occurs in males or females. ? Ovarian. ? Tubal. This may also be called fallopian tube cancer. ? Cancer of the abdominal or pelvic lining (peritoneal cancer). ? Prostate. ? Pancreatic. Cervical, Uterine, and Ovarian Cancer Your health care provider may recommend that you be screened regularly for cancer of the pelvic organs. These include your ovaries, uterus, and vagina. This screening involves a pelvic exam, which includes checking for microscopic changes to the surface of your cervix (Pap test).  For women ages 21-65, health care providers may recommend a pelvic exam and a Pap test every three years. For women ages 39-65, they may recommend the Pap test and pelvic exam, combined with testing for human papilloma virus (HPV), every five years. Some types of HPV increase your risk of cervical cancer. Testing for HPV may also be done on women of any age who have unclear Pap test results.  Other health care providers may not recommend any screening for nonpregnant women who are considered low risk for pelvic cancer and have no symptoms. Ask your health care provider if a screening pelvic exam is right for you.  If you have had past treatment for cervical cancer or a condition that could lead to cancer, you need Pap tests and screening for cancer for at least 20 years after your treatment. If Pap tests have been discontinued for you, your risk factors (such as having a new sexual partner) need to be reassessed  to determine if you should start having screenings again. Some women have medical problems that increase the chance of getting cervical cancer. In these cases, your health care provider may recommend that you have screening and Pap tests more often.  If you have a family history of uterine cancer or ovarian cancer, talk with your health care provider about genetic screening.  If you have vaginal bleeding after reaching menopause, tell your health care provider.  There are currently no reliable tests available to screen for ovarian cancer. Lung Cancer Lung cancer screening is recommended for adults 57-50 years old who are at high risk for lung cancer because of a history of smoking. A yearly low-dose CT scan of the lungs is recommended if you:  Currently smoke.  Have a history of at least 30 pack-years of smoking and you currently smoke or have quit within the past 15 years. A pack-year is smoking an average of one pack of cigarettes per day for one year. Yearly screening should:  Continue until it has been 15 years since you quit.  Stop if you develop a health problem that would prevent you from having lung cancer treatment. Colorectal Cancer  This type of cancer can be detected and can often be prevented.  Routine colorectal cancer screening usually begins at age 12 and continues through  age 63.  If you have risk factors for colon cancer, your health care provider may recommend that you be screened at an earlier age.  If you have a family history of colorectal cancer, talk with your health care provider about genetic screening.  Your health care provider may also recommend using home test kits to check for hidden blood in your stool.  A small camera at the end of a tube can be used to examine your colon directly (sigmoidoscopy or colonoscopy). This is done to check for the earliest forms of colorectal cancer.  Direct examination of the colon should be repeated every 5-10 years until  age 75. However, if early forms of precancerous polyps or small growths are found or if you have a family history or genetic risk for colorectal cancer, you may need to be screened more often. Skin Cancer  Check your skin from head to toe regularly.  Monitor any moles. Be sure to tell your health care provider: ? About any new moles or changes in moles, especially if there is a change in a mole's shape or color. ? If you have a mole that is larger than the size of a pencil eraser.  If any of your family members has a history of skin cancer, especially at a  age, talk with your health care provider about genetic screening.  Always use sunscreen. Apply sunscreen liberally and repeatedly throughout the day.  Whenever you are outside, protect yourself by wearing long sleeves, pants, a wide-brimmed hat, and sunglasses. What should I know about osteoporosis? Osteoporosis is a condition in which bone destruction happens more quickly than new bone creation. After menopause, you may be at an increased risk for osteoporosis. To help prevent osteoporosis or the bone fractures that can happen because of osteoporosis, the following is recommended:  If you are 59-59 years old, get at least 1,000 mg of calcium and at least 600 mg of vitamin D per day.  If you are older than age 36 but er than age 32, get at least 1,200 mg of calcium and at least 600 mg of vitamin D per day.  If you are older than age 47, get at least 1,200 mg of calcium and at least 800 mg of vitamin D per day. Smoking and excessive alcohol intake increase the risk of osteoporosis. Eat foods that are rich in calcium and vitamin D, and do weight-bearing exercises several times each week as directed by your health care provider. What should I know about how menopause affects my mental health? Depression may occur at any age, but it is more common as you become older. Common symptoms of depression include:  Low or sad mood.   Changes in sleep patterns.  Changes in appetite or eating patterns.  Feeling an overall lack of motivation or enjoyment of activities that you previously enjoyed.  Frequent crying spells. Talk with your health care provider if you think that you are experiencing depression. What should I know about immunizations? It is important that you get and maintain your immunizations. These include:  Tetanus, diphtheria, and pertussis (Tdap) booster vaccine.  Influenza every year before the flu season begins.  Pneumonia vaccine.  Shingles vaccine. Your health care provider may also recommend other immunizations. This information is not intended to replace advice given to you by your health care provider. Make sure you discuss any questions you have with your health care provider. Document Released: 03/12/2005 Document Revised: 08/08/2015 Document Reviewed: 10/22/2014 Elsevier Interactive Patient Education  2019 [place].

## 2018-06-19 NOTE — Progress Notes (Signed)
Gail Ford 10-Mar-1960 [id number]    History:    Presents for annual exam.  Postmenopausal on HRT with good relief of symptoms with no bleeding.  Has been on Prozac 10 mg for the past 5 months and is doing much better with mood, and decreased anxiety.  Normal Pap and mammogram history.  Negative colonoscopy at age 58.  Very active lifestyle does [employer] triathlons has had a hip muscle injury in the past year but doing much better now.  Past medical history, past surgical history, family history and social history were all reviewed and documented in the EPIC chart.  Physical therapist.  Husband recovered from thyroid cancer in 2019 doing well.  Gail Ford 8 doing well with 2 sons also well.  ROS:  A ROS was performed and pertinent positives and negatives are included.  Exam:  Vitals:   06/19/18 1526  BP: 122/78  Weight: 125 lb (56.7 kg)  Height: 5\' 5"  (1.651 m)   Body mass index is 20.8 kg/m.   General appearance:  Normal Thyroid:  Symmetrical, normal in size, without palpable masses or nodularity. Respiratory  Auscultation:  Clear without wheezing or rhonchi Cardiovascular  Auscultation:  Regular rate, without rubs, murmurs or gallops  Edema/varicosities:  Not grossly evident Abdominal  Soft,nontender, without masses, guarding or rebound.  Liver/spleen:  No organomegaly noted  Hernia:  None appreciated  Skin  Inspection:  Grossly normal   Breasts: Examined lying and sitting.     Right: Without masses, retractions, discharge or axillary adenopathy.     Left: Without masses, retractions, discharge or axillary adenopathy. Gentitourinary   Inguinal/mons:  Normal without inguinal adenopathy  External genitalia:  Normal  BUS/Urethra/Skene's glands:  Normal  Vagina:  Normal  Cervix:  Normal  Uterus:   normal in size, shape and contour.  Midline and mobile  Adnexa/parametria:     Rt: Without masses or tenderness.   Lt: Without masses or tenderness.  Anus and  perineum: Normal  Digital rectal exam: Normal sphincter tone without palpated masses or tenderness  Assessment/Plan:  58 y.o. WF G3, P2 for annual exam with no complaints.  Postmenopausal on HRT (started 2019) with no bleeding and good relief of symptoms Anxiety/depression stable on Prozac Primary care-labs  Plan: HRT reviewed slight risks of blood clots strokes and breast cancer, states has felt so much better on it will continue, reviewed [facility] and best to be on HRT less than 7 years..  Prometrium 200 mg day 1 through 12 at bedtime, Vivelle patch 0.05 twice weekly, prescriptions for both given.  Will continue on Prozac 10 mg p.o. daily, counseling as needed has had in the past.  Continue healthy lifestyle of regular exercise and healthy diet.  SBEs, continue annual screening mammogram, calcium rich foods, vitamin D 2000 daily encouraged.  Pap with HR HPV typing, new screening guidelines reviewed.    Harrington Challenger [facility], 4:01 PM 06/19/2018

## 2018-06-20 LAB — URINALYSIS, COMPLETE W/RFL CULTURE
Bacteria, UA: NONE SEEN /HPF
Bilirubin Urine: NEGATIVE
Glucose, UA: NEGATIVE
Hgb urine dipstick: NEGATIVE
Hyaline Cast: NONE SEEN /LPF
Ketones, ur: NEGATIVE
Leukocyte Esterase: NEGATIVE
Nitrites, Initial: NEGATIVE
Protein, ur: NEGATIVE
RBC / HPF: NONE SEEN /HPF (ref 0–2)
Specific Gravity, Urine: 1.011 (ref 1.001–1.03)
Squamous Epithelial / LPF: NONE SEEN /HPF (ref <=5)
WBC, UA: NONE SEEN /HPF (ref 0–5)
pH: 7.5 (ref 5.0–8.0)

## 2018-06-20 LAB — NO CULTURE INDICATED

## 2018-06-21 LAB — PAP, TP IMAGING W/ HPV RNA, RFLX HPV TYPE 16,18/45: HPV DNA High Risk: NOT DETECTED

## 2018-10-26 ENCOUNTER — Other Ambulatory Visit: Payer: Self-pay

## 2018-10-26 DIAGNOSIS — T385X5D Adverse effect of other estrogens and progestogens, subsequent encounter: Secondary | ICD-10-CM

## 2018-10-26 MED ORDER — PROGESTERONE MICRONIZED 200 MG PO CAPS: ORAL_CAPSULE | ORAL | 2 refills | Status: DC

## 2018-10-27 ENCOUNTER — Other Ambulatory Visit: Payer: Self-pay | Admitting: Women's Health

## 2018-10-27 DIAGNOSIS — Z1231 Encounter for screening mammogram for malignant neoplasm of breast: Secondary | ICD-10-CM

## 2018-12-12 ENCOUNTER — Other Ambulatory Visit: Payer: Self-pay

## 2018-12-12 ENCOUNTER — Ambulatory Visit
Admission: RE | Admit: 2018-12-12 | Discharge: 2018-12-12 | Disposition: A | Discharge status: Home / Self Care | Payer: PRIVATE HEALTH INSURANCE | Source: Ambulatory Visit | Attending: Women's Health | Admitting: Women's Health

## 2018-12-12 DIAGNOSIS — Z1231 Encounter for screening mammogram for malignant neoplasm of breast: Secondary | ICD-10-CM

## 2019-06-27 ENCOUNTER — Encounter: Payer: Self-pay | Admitting: Nurse Practitioner

## 2019-06-27 ENCOUNTER — Ambulatory Visit (INDEPENDENT_AMBULATORY_CARE_PROVIDER_SITE_OTHER): Payer: PRIVATE HEALTH INSURANCE | Admitting: Nurse Practitioner

## 2019-06-27 ENCOUNTER — Other Ambulatory Visit: Payer: Self-pay

## 2019-06-27 VITALS — BP 110/80 | Ht 65.0 in | Wt 128.0 lb

## 2019-06-27 DIAGNOSIS — N952 Postmenopausal atrophic vaginitis: Secondary | ICD-10-CM | POA: Diagnosis not present

## 2019-06-27 DIAGNOSIS — Z79890 Hormone replacement therapy (postmenopausal): Secondary | ICD-10-CM | POA: Diagnosis not present

## 2019-06-27 DIAGNOSIS — Z01419 Encounter for gynecological examination (general) (routine) without abnormal findings: Secondary | ICD-10-CM | POA: Diagnosis not present

## 2019-06-27 DIAGNOSIS — Z13220 Encounter for screening for lipoid disorders: Secondary | ICD-10-CM | POA: Diagnosis not present

## 2019-06-27 MED ORDER — PREMARIN 0.625 MG/GM VA CREA: 1.0000 | TOPICAL_CREAM | Freq: Every day | VAGINAL | 12 refills | Status: DC

## 2019-06-27 NOTE — Patient Instructions (Addendum)
Schedule Colonoscopy   GI [phone] [address] [place], [place] [address]   Health Maintenance, Female Adopting a healthy lifestyle and getting preventive care are important in promoting health and wellness. Ask your health care provider about:  The right schedule for you to have regular tests and exams.  Things you can do on your own to prevent diseases and keep yourself healthy. What should I know about diet, weight, and exercise? Eat a healthy diet   Eat a diet that includes plenty of vegetables, fruits, low-fat dairy products, and lean protein.  Do not eat a lot of foods that are high in solid fats, added sugars, or sodium. Maintain a healthy weight Body mass index (BMI) is used to identify weight problems. It estimates body fat based on height and weight. Your health care provider can help determine your BMI and help you achieve or maintain a healthy weight. Get regular exercise Get regular exercise. This is one of the most important things you can do for your health. Most adults should:  Exercise for at least 150 minutes each week. The exercise should increase your heart rate and make you sweat (moderate-intensity exercise).  Do strengthening exercises at least twice a week. This is in addition to the moderate-intensity exercise.  Spend less time sitting. Even light physical activity can be beneficial. Watch cholesterol and blood lipids Have your blood tested for lipids and cholesterol at 57 years of age, then have this test every 5 years. Have your cholesterol levels checked more often if:  Your lipid or cholesterol levels are high.  You are older than 57 years of age.  You are at high risk for heart disease. What should I know about cancer screening? Depending on your health history and family history, you may need to have cancer screening at various ages. This may include screening for:  Breast cancer.  Cervical cancer.  Colorectal cancer.  Skin  cancer.  Lung cancer. What should I know about heart disease, diabetes, and high blood pressure? Blood pressure and heart disease  High blood pressure causes heart disease and increases the risk of stroke. This is more likely to develop in people who have high blood pressure readings, are of African descent, or are overweight.  Have your blood pressure checked: ? Every 3-5 years if you are 18-39 years of age. ? Every year if you are 40 years old or older. Diabetes Have regular diabetes screenings. This checks your fasting blood sugar level. Have the screening done:  Once every three years after age 40 if you are at a normal weight and have a low risk for diabetes.  More often and at a younger age if you are overweight or have a high risk for diabetes. What should I know about preventing infection? Hepatitis B If you have a higher risk for hepatitis B, you should be screened for this virus. Talk with your health care provider to find out if you are at risk for hepatitis B infection. Hepatitis C Testing is recommended for:  Everyone born from 1945 through 1965.  Anyone with known risk factors for hepatitis C. Sexually transmitted infections (STIs)  Get screened for STIs, including gonorrhea and chlamydia, if: ? You are sexually active and are younger than 57 years of age. ? You are older than 57 years of age and your health care provider tells you that you are at risk for this type of infection. ? Your sexual activity has changed since you were last screened,   and you are at increased risk for chlamydia or gonorrhea. Ask your health care provider if you are at risk.  Ask your health care provider about whether you are at high risk for HIV. Your health care provider may recommend a prescription medicine to help prevent HIV infection. If you choose to take medicine to prevent HIV, you should first get tested for HIV. You should then be tested every 3 months for as long as you are taking  the medicine. Pregnancy  If you are about to stop having your period (premenopausal) and you may become pregnant, seek counseling before you get pregnant.  Take 400 to 800 micrograms (mcg) of folic acid every day if you become pregnant.  Ask for birth control (contraception) if you want to prevent pregnancy. Osteoporosis and menopause Osteoporosis is a disease in which the bones lose minerals and strength with aging. This can result in bone fractures. If you are 65 years old or older, or if you are at risk for osteoporosis and fractures, ask your health care provider if you should:  Be screened for bone loss.  Take a calcium or vitamin D supplement to lower your risk of fractures.  Be given hormone replacement therapy (HRT) to treat symptoms of menopause. Follow these instructions at home: Lifestyle  Do not use any products that contain nicotine or tobacco, such as cigarettes, e-cigarettes, and chewing tobacco. If you need help quitting, ask your health care provider.  Do not use street drugs.  Do not share needles.  Ask your health care provider for help if you need support or information about quitting drugs. Alcohol use  Do not drink alcohol if: ? Your health care provider tells you not to drink. ? You are pregnant, may be pregnant, or are planning to become pregnant.  If you drink alcohol: ? Limit how much you use to 0-1 drink a day. ? Limit intake if you are breastfeeding.  Be aware of how much alcohol is in your drink. In the [place] one drink equals one 12 oz bottle of beer (355 mL), one 5 oz glass of wine (148 mL), or one 1 oz glass of hard liquor (44 mL). General instructions  Schedule regular health, dental, and eye exams.  Stay current with your vaccines.  Tell your health care provider if: ? You often feel depressed. ? You have ever been abused or do not feel safe at home. Summary  Adopting a healthy lifestyle and getting preventive care are important in  promoting health and wellness.  Follow your health care provider's instructions about healthy diet, exercising, and getting tested or screened for diseases.  Follow your health care provider's instructions on monitoring your cholesterol and blood pressure. This information is not intended to replace advice given to you by your health care provider. Make sure you discuss any questions you have with your health care provider. Document Revised: 01/11/2018 Document Reviewed: 01/11/2018 Elsevier Patient Education  2020 [employer]  

## 2019-06-27 NOTE — Progress Notes (Signed)
   Gail Ford 11-Oct-1960 [id number]   History:  59 y.o. G3 P2 presents for annual exam without GYN complaints.  Postmenopausal on HRT with no bleeding.  HRT is managing menopausal symptoms well.  Normal Pap and mammo and history. 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   Gynecologic History No LMP recorded (lmp unknown). Patient is postmenopausal.   Last Pap: 06/20/2018. Results were: normal Last mammogram: 12/12/2018. Results were:  normal Last colonoscopy: at age 77 per patient, we do not have records. Results were: normal  Past medical history, past surgical history, family history and social history were all reviewed and documented in the EPIC chart.  Physical therapist. 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   ROS:  A ROS was performed and pertinent positives and negatives are included.  Exam:  Vitals:   06/27/19 0800  Weight: 128 lb (58.1 kg)  Height: 5\' 5"  (1.651 m)   Body mass index is 21.3 kg/m.  General appearance:  Normal Thyroid:  Symmetrical, normal in size, without palpable masses or nodularity. Respiratory  Auscultation:  Clear without wheezing or rhonchi Cardiovascular  Auscultation:  Regular rate, without rubs, murmurs or gallops  Edema/varicosities:  Not grossly evident Abdominal  Soft,nontender, without masses, guarding or rebound.  Liver/spleen:  No organomegaly noted  Hernia:  None appreciated  Skin  Inspection:  Grossly normal   Breasts: Examined lying and sitting.   Right: Without masses, retractions, discharge or axillary adenopathy.   Left: Without masses, retractions, discharge or axillary adenopathy. Gentitourinary   Inguinal/mons:  Normal without inguinal adenopathy  External genitalia:  Normal  BUS/Urethra/Skene's glands:  Normal  Vagina:  Atrophy changes, 1+ rectocele, 1+cystocele  Cervix:  Normal  Uterus:  Normal in size, shape and contour.  Midline and mobile  Adnexa/parametria:     Rt: Without masses or  tenderness.   Lt: Without masses or tenderness.  Anus and perineum: Normal  Digital rectal exam: Normal sphincter tone without palpated masses or tenderness  Assessment/Plan:  59 y.o.  for annual exam.    Well female exam with routine gynecological exam - Plan: CBC with Differential/Platelet, Comprehensive metabolic panel. Education provided on SBEs, importance of preventative screenings, current guidelines, high calcium diet, regular exercise, and multivitamin daily.   Hormone replacement therapy, postmenopausal - continue current regimen of Estradiol 0.05 mg twice weekly patch with Prometrium 200 mg nightly days 1-12 of cycle. She is a little concerned about being on hormone therapy. Discussed her low risks and the benefits of therapy.Discussed stopping therapy for 1 month to see how she does.   Vaginal atrophy - Plan: conjugated estrogens (PREMARIN) vaginal cream. Also encouraged Kegel exercises. Does have urinary leakage at times with 1+ rectocele/cystocele, most likely from history of long distance running.  Lipid screening - Plan: Lipid panel  Will come back fasting for labs. Follow up in 1 year for annual.    3-12 [facility], 8:01 AM 06/27/2019

## 2019-07-09 ENCOUNTER — Other Ambulatory Visit: Payer: PRIVATE HEALTH INSURANCE

## 2019-07-09 ENCOUNTER — Other Ambulatory Visit: Payer: Self-pay

## 2019-07-09 DIAGNOSIS — Z01419 Encounter for gynecological examination (general) (routine) without abnormal findings: Secondary | ICD-10-CM

## 2019-07-09 DIAGNOSIS — Z13220 Encounter for screening for lipoid disorders: Secondary | ICD-10-CM

## 2019-07-09 LAB — LIPID PANEL
Cholesterol: 190 mg/dL (ref <200)
HDL: 73 mg/dL (ref >=50)
LDL Cholesterol (Calc): 105 mg/dL (calc) — ABNORMAL HIGH
Non-HDL Cholesterol (Calc): 117 mg/dL (calc) (ref <130)
Total CHOL/HDL Ratio: 2.6 (calc) (ref <5.0)
Triglycerides: 40 mg/dL (ref <150)

## 2019-07-09 LAB — COMPREHENSIVE METABOLIC PANEL
AG Ratio: 1.8 (calc) (ref 1.0–2.5)
ALT: 13 U/L (ref 6–29)
AST: 27 U/L (ref 10–35)
Albumin: 4.3 g/dL (ref 3.6–5.1)
Alkaline phosphatase (APISO): 45 U/L (ref 37–153)
BUN: 24 mg/dL (ref 7–25)
CO2: 27 mmol/L (ref 20–32)
Calcium: 9.4 mg/dL (ref 8.6–10.4)
Chloride: 104 mmol/L (ref 98–110)
Creat: 0.9 mg/dL (ref 0.50–1.05)
Globulin: 2.4 g/dL (calc) (ref 1.9–3.7)
Glucose, Bld: 99 mg/dL (ref 65–99)
Potassium: 4.3 mmol/L (ref 3.5–5.3)
Sodium: 140 mmol/L (ref 135–146)
Total Bilirubin: 2 mg/dL — ABNORMAL HIGH (ref 0.2–1.2)
Total Protein: 6.7 g/dL (ref 6.1–8.1)

## 2019-07-09 LAB — CBC WITH DIFFERENTIAL/PLATELET
Absolute Monocytes: 621 cells/uL (ref 200–950)
Basophils Absolute: 41 cells/uL (ref 0–200)
Basophils Relative: 0.6 %
Eosinophils Absolute: 62 cells/uL (ref 15–500)
Eosinophils Relative: 0.9 %
HCT: 37.8 % (ref 35.0–45.0)
Hemoglobin: 12.5 g/dL (ref 11.7–15.5)
Lymphs Abs: 2305 cells/uL (ref 850–3900)
MCH: 32.4 pg (ref 27.0–33.0)
MCHC: 33.1 g/dL (ref 32.0–36.0)
MCV: 97.9 fL (ref 80.0–100.0)
MPV: 10.4 fL (ref 7.5–12.5)
Monocytes Relative: 9 %
Neutro Abs: 3871 cells/uL (ref 1500–7800)
Neutrophils Relative %: 56.1 %
Platelets: 249 10*3/uL (ref 140–400)
RBC: 3.86 10*6/uL (ref 3.80–5.10)
RDW: 12.4 % (ref 11.0–15.0)
Total Lymphocyte: 33.4 %
WBC: 6.9 10*3/uL (ref 3.8–10.8)

## 2019-07-10 ENCOUNTER — Other Ambulatory Visit: Payer: PRIVATE HEALTH INSURANCE

## 2019-07-24 ENCOUNTER — Other Ambulatory Visit: Payer: Self-pay

## 2019-07-24 DIAGNOSIS — T385X5D Adverse effect of other estrogens and progestogens, subsequent encounter: Secondary | ICD-10-CM

## 2019-07-24 MED ORDER — ESTRADIOL 0.05 MG/24HR TD PTTW: 1.0000 | MEDICATED_PATCH | TRANSDERMAL | 3 refills | Status: DC

## 2019-08-13 ENCOUNTER — Other Ambulatory Visit: Payer: Self-pay

## 2019-08-13 MED ORDER — PROGESTERONE 200 MG PO CAPS: ORAL_CAPSULE | ORAL | 3 refills | Status: DC

## 2019-12-25 ENCOUNTER — Other Ambulatory Visit: Payer: Self-pay | Admitting: Nurse Practitioner

## 2019-12-25 DIAGNOSIS — Z1231 Encounter for screening mammogram for malignant neoplasm of breast: Secondary | ICD-10-CM

## 2019-12-31 ENCOUNTER — Ambulatory Visit
Admission: RE | Admit: 2019-12-31 | Discharge: 2019-12-31 | Disposition: A | Discharge status: Home / Self Care | Payer: PRIVATE HEALTH INSURANCE | Source: Ambulatory Visit

## 2019-12-31 ENCOUNTER — Other Ambulatory Visit: Payer: Self-pay

## 2019-12-31 DIAGNOSIS — Z1231 Encounter for screening mammogram for malignant neoplasm of breast: Secondary | ICD-10-CM

## 2020-06-26 ENCOUNTER — Encounter: Payer: Self-pay | Admitting: Nurse Practitioner

## 2020-07-01 ENCOUNTER — Other Ambulatory Visit: Payer: Self-pay

## 2020-07-01 ENCOUNTER — Ambulatory Visit (INDEPENDENT_AMBULATORY_CARE_PROVIDER_SITE_OTHER): Payer: PRIVATE HEALTH INSURANCE | Admitting: Nurse Practitioner

## 2020-07-01 ENCOUNTER — Encounter: Payer: Self-pay | Admitting: Nurse Practitioner

## 2020-07-01 VITALS — BP 116/70 | Ht 64.5 in | Wt 128.0 lb

## 2020-07-01 DIAGNOSIS — Z01419 Encounter for gynecological examination (general) (routine) without abnormal findings: Secondary | ICD-10-CM | POA: Diagnosis not present

## 2020-07-01 DIAGNOSIS — Z780 Asymptomatic menopausal state: Secondary | ICD-10-CM

## 2020-07-01 NOTE — Progress Notes (Signed)
   Gail Ford [birth year] [id number]   History:  60 y.o. [id number] presents for annual exam without GYN complaints. Postmenopausal - stopped HRT 7 months ago and is tolerating well. She has occasional hot flashes. Normal pap and mammogram history.   Gynecologic History No LMP recorded (lmp unknown). Patient is postmenopausal.   Contraception/Family planning: post menopausal status  Health Maintenance Last Pap: 06/20/2018. Results were: normal Last mammogram: 12/31/2019. Results were: normal Last colonoscopy: at age 60. Results were: normal per patient Last Dexa: Not indicated  Past medical history, past surgical history, family history and social history were all reviewed and documented in the EPIC chart. Married. Physical therapist.Triathlete. 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 Mother with osteoporosis.   ROS:  A ROS was performed and pertinent positives and negatives are included.  Exam:  Vitals:   07/01/20 0805  BP: 116/70  Weight: 128 lb (58.1 kg)  Height: 5' 4.5" (1.638 m)   Body mass index is 21.63 kg/m.  General appearance:  Normal Thyroid:  Symmetrical, normal in size, without palpable masses or nodularity. Respiratory  Auscultation:  Clear without wheezing or rhonchi Cardiovascular  Auscultation:  Regular rate, without rubs, murmurs or gallops  Edema/varicosities:  Not grossly evident Abdominal  Soft,nontender, without masses, guarding or rebound.  Liver/spleen:  No organomegaly noted  Hernia:  None appreciated  Skin  Inspection:  Grossly normal Breasts: Examined lying and sitting.   Right: Without masses, retractions, nipple discharge or axillary adenopathy.   Left: Without masses, retractions, nipple discharge or axillary adenopathy. Genitourinary   Inguinal/mons:  Normal without inguinal adenopathy  External genitalia:  Normal appearing vulva with no masses, tenderness, or lesions  BUS/Urethra/Skene's glands:  Normal  Vagina:  Normal appearing with normal color and  discharge, no lesions  Cervix:  Normal appearing without discharge or lesions  Uterus:  Normal in size, shape and contour.  Midline and mobile, nontender  Adnexa/parametria:     Rt: Normal in size, without masses or tenderness.   Lt: Normal in size, without masses or tenderness.  Anus and perineum: Normal  Digital rectal exam: Normal sphincter tone without palpated masses or tenderness  Assessment/Plan:  60 y.o. Q2I2979 for annual exam.   Well female exam with routine gynecological exam - Plan: CBC with Differential/Platelet, Comprehensive metabolic panel, Lipid panel. Education provided on SBEs, importance of preventative screenings, current guidelines, high calcium diet, regular exercise, and multivitamin daily.   Postmenopausal - stopped HRT 7 months ago and is tolerating well. She has occasional hot flashes. No bleeding.   Screening for cervical cancer - Normal Pap history.  Will repeat at 5-year interval per guidelines.  Screening for breast cancer - Normal mammogram history.  Continue annual screenings.  Normal breast exam today.  Screening for colon cancer - Normal colonoscopy at age 60. Will repeat at GI's recommended interval.   Screening for osteoporosis - mother has osteoporosis. Recommend DXA at age 48 and starting Vitamin D supplement. She is very active, triathlete.   Return in 1 year for annual.     Olivia Mackie DNP, 8:20 AM 07/01/2020

## 2020-07-01 NOTE — Patient Instructions (Signed)

## 2020-07-02 LAB — COMPREHENSIVE METABOLIC PANEL
AG Ratio: 2 (calc) (ref 1.0–2.5)
ALT: 10 U/L (ref 6–29)
AST: 18 U/L (ref 10–35)
Albumin: 4.7 g/dL (ref 3.6–5.1)
Alkaline phosphatase (APISO): 52 U/L (ref 37–153)
BUN: 18 mg/dL (ref 7–25)
CO2: 29 mmol/L (ref 20–32)
Calcium: 9.5 mg/dL (ref 8.6–10.4)
Chloride: 103 mmol/L (ref 98–110)
Creat: 0.91 mg/dL (ref 0.50–1.05)
Globulin: 2.3 g/dL (calc) (ref 1.9–3.7)
Glucose, Bld: 93 mg/dL (ref 65–99)
Potassium: 4.1 mmol/L (ref 3.5–5.3)
Sodium: 141 mmol/L (ref 135–146)
Total Bilirubin: 0.9 mg/dL (ref 0.2–1.2)
Total Protein: 7 g/dL (ref 6.1–8.1)

## 2020-07-02 LAB — LIPID PANEL
Cholesterol: 244 mg/dL — ABNORMAL HIGH (ref <200)
HDL: 79 mg/dL (ref >=50)
LDL Cholesterol (Calc): 149 mg/dL (calc) — ABNORMAL HIGH
Non-HDL Cholesterol (Calc): 165 mg/dL (calc) — ABNORMAL HIGH (ref <130)
Total CHOL/HDL Ratio: 3.1 (calc) (ref <5.0)
Triglycerides: 56 mg/dL (ref <150)

## 2020-07-02 LAB — CBC WITH DIFFERENTIAL/PLATELET
Absolute Monocytes: 402 cells/uL (ref 200–950)
Basophils Absolute: 29 cells/uL (ref 0–200)
Basophils Relative: 0.6 %
Eosinophils Absolute: 88 cells/uL (ref 15–500)
Eosinophils Relative: 1.8 %
HCT: 38.8 % (ref 35.0–45.0)
Hemoglobin: 12.9 g/dL (ref 11.7–15.5)
Lymphs Abs: 1906 cells/uL (ref 850–3900)
MCH: 32.1 pg (ref 27.0–33.0)
MCHC: 33.2 g/dL (ref 32.0–36.0)
MCV: 96.5 fL (ref 80.0–100.0)
MPV: 10 fL (ref 7.5–12.5)
Monocytes Relative: 8.2 %
Neutro Abs: 2475 cells/uL (ref 1500–7800)
Neutrophils Relative %: 50.5 %
Platelets: 278 10*3/uL (ref 140–400)
RBC: 4.02 10*6/uL (ref 3.80–5.10)
RDW: 12.9 % (ref 11.0–15.0)
Total Lymphocyte: 38.9 %
WBC: 4.9 10*3/uL (ref 3.8–10.8)

## 2020-11-14 ENCOUNTER — Ambulatory Visit: Payer: PRIVATE HEALTH INSURANCE | Attending: Internal Medicine

## 2020-11-14 DIAGNOSIS — Z23 Encounter for immunization: Secondary | ICD-10-CM

## 2020-11-14 NOTE — Progress Notes (Signed)
   Covid-19 Vaccination Clinic  Name:  Gail Ford    MRN: [id number] DOB: 1960-09-08  11/14/2020  [name] was observed post Covid-19 immunization for 15 minutes without incident. She was provided with Vaccine Information Sheet and instruction to access the V-Safe system.   [name] was instructed to call 911 with any severe reactions post vaccine: Difficulty breathing  Swelling of face and throat  A fast heartbeat  A bad rash all over body  Dizziness and weakness

## 2020-12-05 ENCOUNTER — Other Ambulatory Visit (HOSPITAL_BASED_OUTPATIENT_CLINIC_OR_DEPARTMENT_OTHER): Payer: Self-pay

## 2020-12-05 MED ORDER — MODERNA COVID-19 BIVAL BOOSTER 50 MCG/0.5ML IM SUSP
INTRAMUSCULAR | 0 refills | Status: DC
  Filled 2020-12-05: qty 0.5, 1d supply, fill #0

## 2020-12-22 ENCOUNTER — Other Ambulatory Visit: Payer: Self-pay | Admitting: Nurse Practitioner

## 2020-12-22 DIAGNOSIS — Z1231 Encounter for screening mammogram for malignant neoplasm of breast: Secondary | ICD-10-CM

## 2021-01-28 ENCOUNTER — Ambulatory Visit
Admission: RE | Admit: 2021-01-28 | Discharge: 2021-01-28 | Disposition: A | Discharge status: Home / Self Care | Payer: 59 | Source: Ambulatory Visit

## 2021-01-28 DIAGNOSIS — Z1231 Encounter for screening mammogram for malignant neoplasm of breast: Secondary | ICD-10-CM

## 2021-03-17 ENCOUNTER — Other Ambulatory Visit (HOSPITAL_BASED_OUTPATIENT_CLINIC_OR_DEPARTMENT_OTHER): Payer: Self-pay

## 2021-03-17 MED ORDER — LATANOPROST 0.005 % OP SOLN
1.0000 [drp] | Freq: Every evening | OPHTHALMIC | 6 refills | Status: DC
  Filled 2021-03-17: qty 2.5, 25d supply, fill #0
  Filled 2021-05-14: qty 2.5, 25d supply, fill #1
  Filled 2021-06-16: qty 2.5, 25d supply, fill #2
  Filled 2021-09-18 (×2): qty 2.5, 25d supply, fill #3
  Filled 2021-10-15: qty 2.5, 25d supply, fill #4
  Filled 2021-12-22: qty 2.5, 25d supply, fill #5
  Filled 2022-01-13: qty 2.5, 25d supply, fill #6

## 2021-04-20 ENCOUNTER — Other Ambulatory Visit (HOSPITAL_BASED_OUTPATIENT_CLINIC_OR_DEPARTMENT_OTHER): Payer: Self-pay

## 2021-04-20 MED ORDER — FLUOXETINE HCL 10 MG PO CAPS
ORAL_CAPSULE | ORAL | 0 refills | Status: DC
  Filled 2021-04-20: qty 90, 90d supply, fill #0

## 2021-05-14 ENCOUNTER — Other Ambulatory Visit (HOSPITAL_BASED_OUTPATIENT_CLINIC_OR_DEPARTMENT_OTHER): Payer: Self-pay

## 2021-06-16 ENCOUNTER — Other Ambulatory Visit (HOSPITAL_BASED_OUTPATIENT_CLINIC_OR_DEPARTMENT_OTHER): Payer: Self-pay

## 2021-07-14 ENCOUNTER — Other Ambulatory Visit (HOSPITAL_COMMUNITY): Payer: Self-pay

## 2021-07-15 ENCOUNTER — Other Ambulatory Visit (HOSPITAL_COMMUNITY): Payer: Self-pay

## 2021-07-15 ENCOUNTER — Other Ambulatory Visit: Payer: Self-pay

## 2021-07-15 MED ORDER — FLUOXETINE HCL 10 MG PO CAPS
10.0000 mg | ORAL_CAPSULE | Freq: Every morning | ORAL | 0 refills | Status: DC
  Filled 2021-07-15 – 2021-07-24 (×2): qty 90, 90d supply, fill #0

## 2021-07-22 ENCOUNTER — Other Ambulatory Visit (HOSPITAL_BASED_OUTPATIENT_CLINIC_OR_DEPARTMENT_OTHER): Payer: Self-pay

## 2021-07-22 MED ORDER — LATANOPROST 0.005 % OP SOLN
OPHTHALMIC | 6 refills | Status: DC
  Filled 2021-07-22: qty 2.5, 25d supply, fill #0
  Filled 2021-09-16: qty 2.5, 25d supply, fill #1
  Filled 2021-09-18: qty 2.5, 25d supply, fill #0

## 2021-07-24 ENCOUNTER — Other Ambulatory Visit (HOSPITAL_BASED_OUTPATIENT_CLINIC_OR_DEPARTMENT_OTHER): Payer: Self-pay

## 2021-07-27 ENCOUNTER — Other Ambulatory Visit (HOSPITAL_COMMUNITY): Payer: Self-pay

## 2021-09-17 ENCOUNTER — Other Ambulatory Visit (HOSPITAL_COMMUNITY): Payer: Self-pay

## 2021-09-18 ENCOUNTER — Other Ambulatory Visit (HOSPITAL_BASED_OUTPATIENT_CLINIC_OR_DEPARTMENT_OTHER): Payer: Self-pay

## 2021-09-18 ENCOUNTER — Other Ambulatory Visit (HOSPITAL_COMMUNITY): Payer: Self-pay

## 2021-09-21 ENCOUNTER — Other Ambulatory Visit (HOSPITAL_COMMUNITY): Payer: Self-pay

## 2021-10-15 ENCOUNTER — Other Ambulatory Visit (HOSPITAL_BASED_OUTPATIENT_CLINIC_OR_DEPARTMENT_OTHER): Payer: Self-pay

## 2021-10-16 ENCOUNTER — Other Ambulatory Visit (HOSPITAL_BASED_OUTPATIENT_CLINIC_OR_DEPARTMENT_OTHER): Payer: Self-pay

## 2021-10-16 MED ORDER — FLUOXETINE HCL 10 MG PO CAPS
10.0000 mg | ORAL_CAPSULE | Freq: Every morning | ORAL | 1 refills | Status: DC
  Filled 2021-10-16: qty 90, 90d supply, fill #0
  Filled 2022-01-13: qty 90, 90d supply, fill #1

## 2021-11-21 ENCOUNTER — Other Ambulatory Visit (HOSPITAL_COMMUNITY): Payer: Self-pay

## 2021-12-23 ENCOUNTER — Other Ambulatory Visit (HOSPITAL_BASED_OUTPATIENT_CLINIC_OR_DEPARTMENT_OTHER): Payer: Self-pay

## 2021-12-23 ENCOUNTER — Other Ambulatory Visit: Payer: Self-pay | Admitting: Nurse Practitioner

## 2021-12-23 DIAGNOSIS — Z1231 Encounter for screening mammogram for malignant neoplasm of breast: Secondary | ICD-10-CM

## 2022-02-05 IMAGING — MG MM DIGITAL SCREENING BILAT W/ TOMO AND CAD
8 series · 9 of 24 positions shown · non-contrast
Comparison: Previous exam(s).

CLINICAL DATA: Screening.

EXAM:
DIGITAL SCREENING BILATERAL MAMMOGRAM WITH TOMOSYNTHESIS AND CAD
TECHNIQUE: Bilateral screening digital craniocaudal and mediolateral oblique
mammograms were obtained. Bilateral screening digital breast
tomosynthesis was performed. The images were evaluated with
computer-aided detection.

[L CC synth-2D]
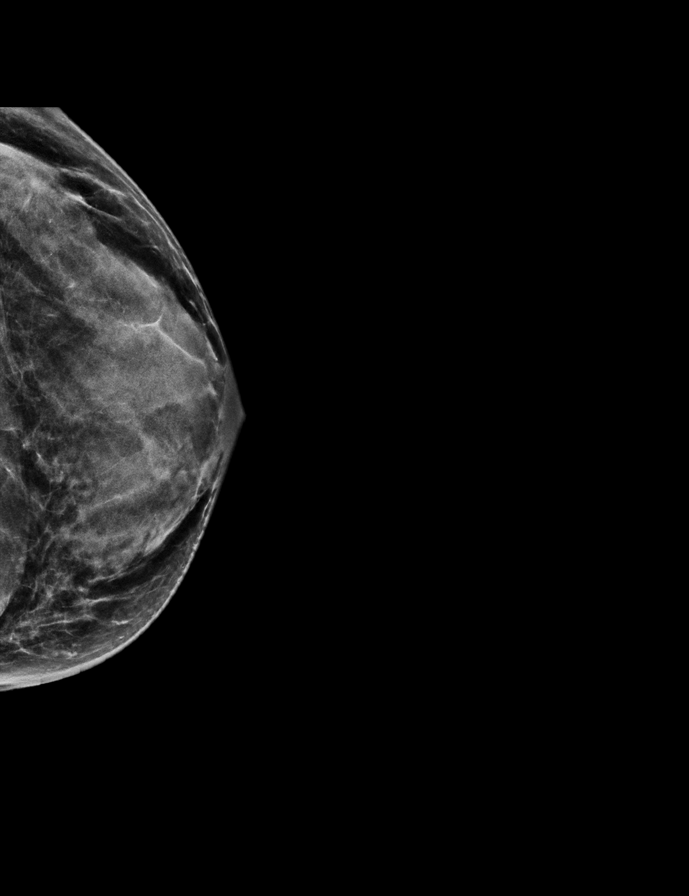

[R MLO synth-2D]
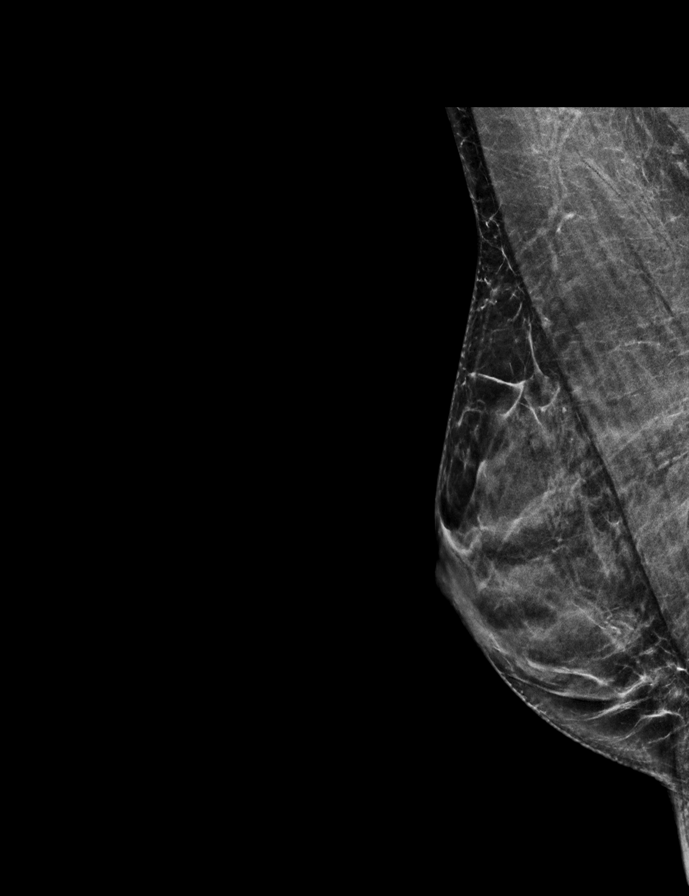

[L MLO synth-2D]
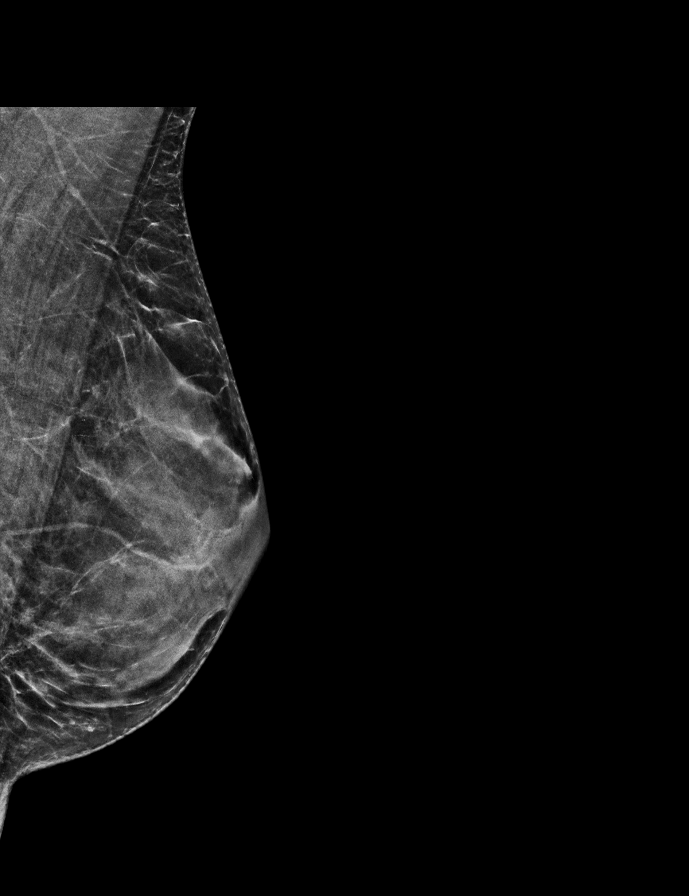

[R CC synth-2D]
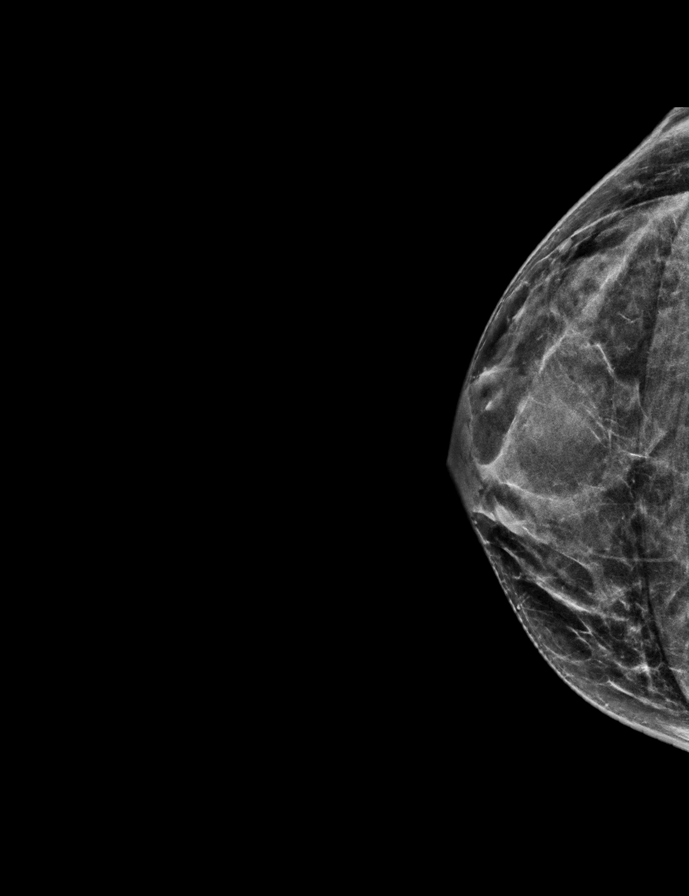

[R MLO tomo · 2 of 54 frames shown]
[frame 18/54]
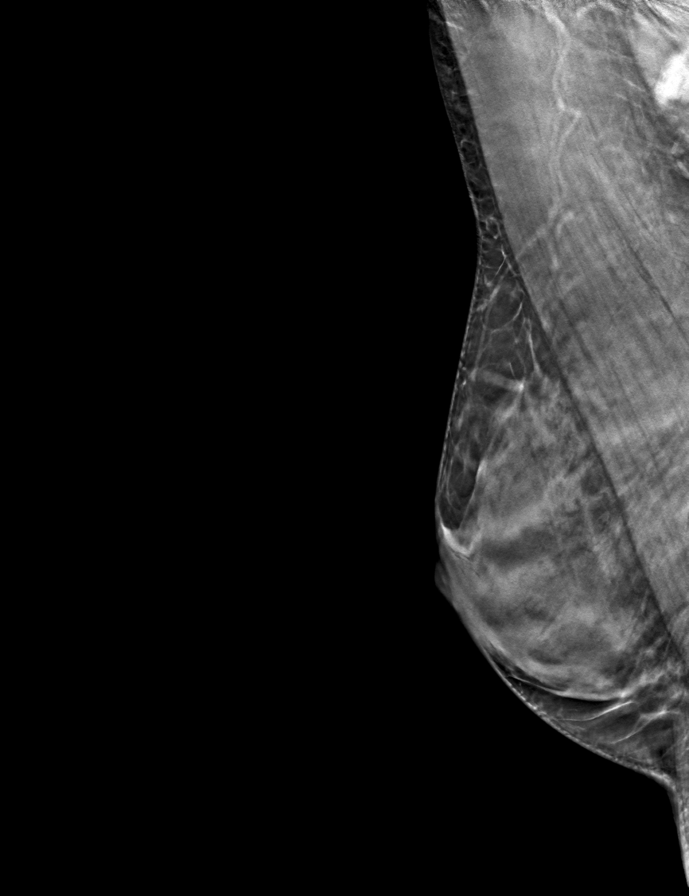
[frame 27/54]
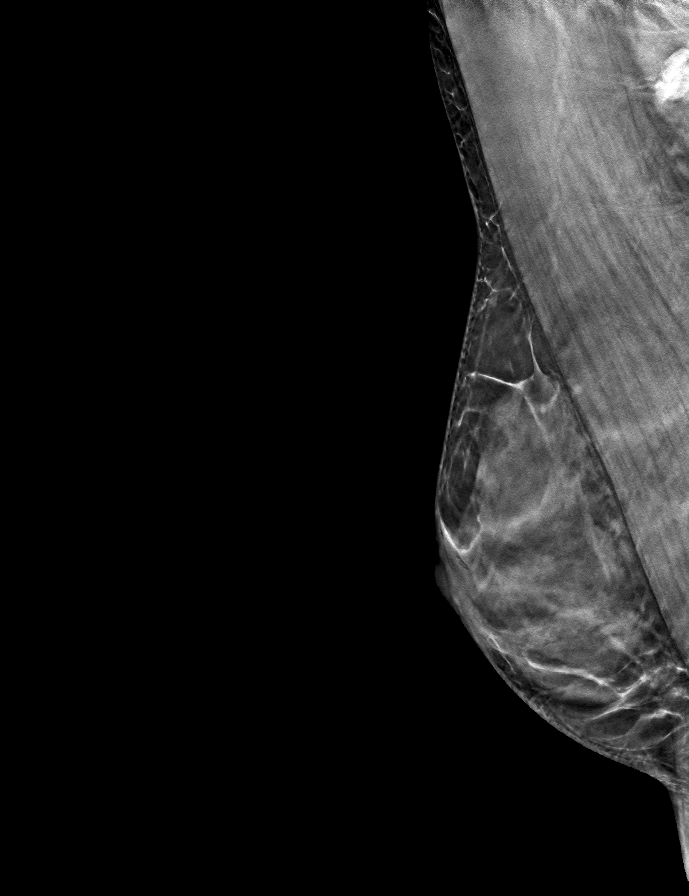

[L MLO tomo · tomo slice 23/46.0]
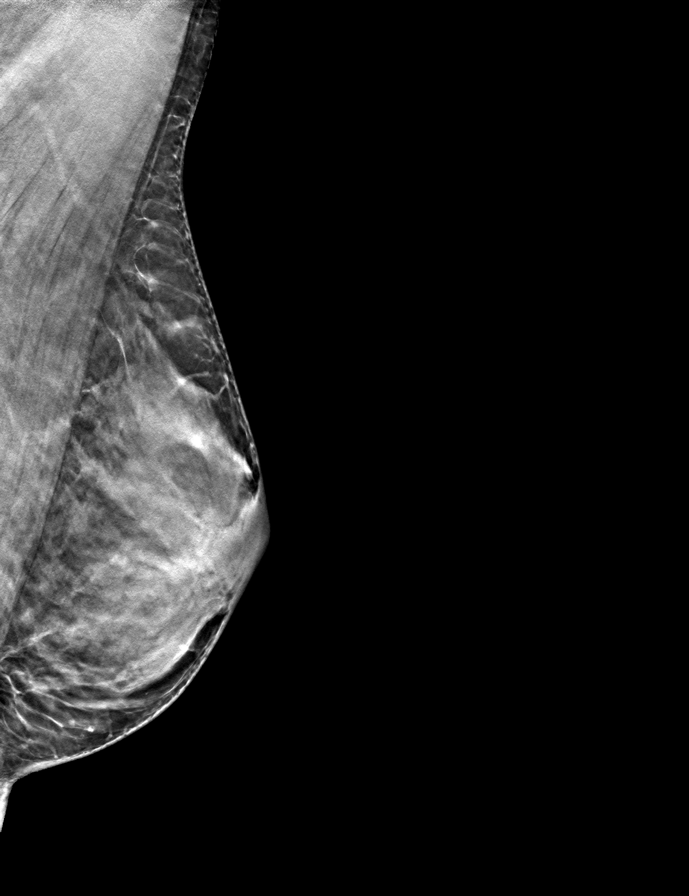

[L CC tomo · tomo slice 23/46.0]
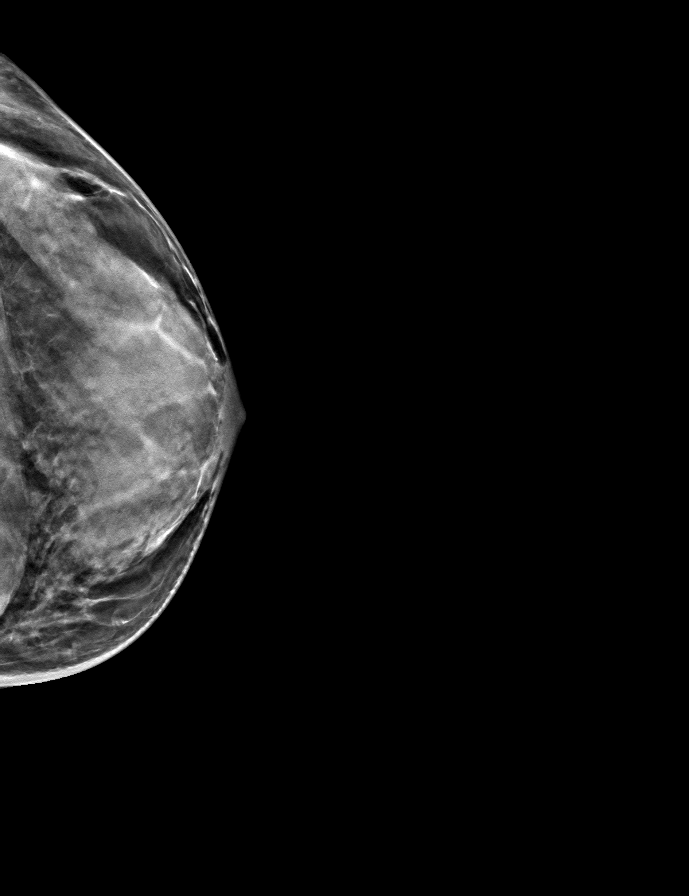

[R CC tomo · tomo slice 25/49.0]
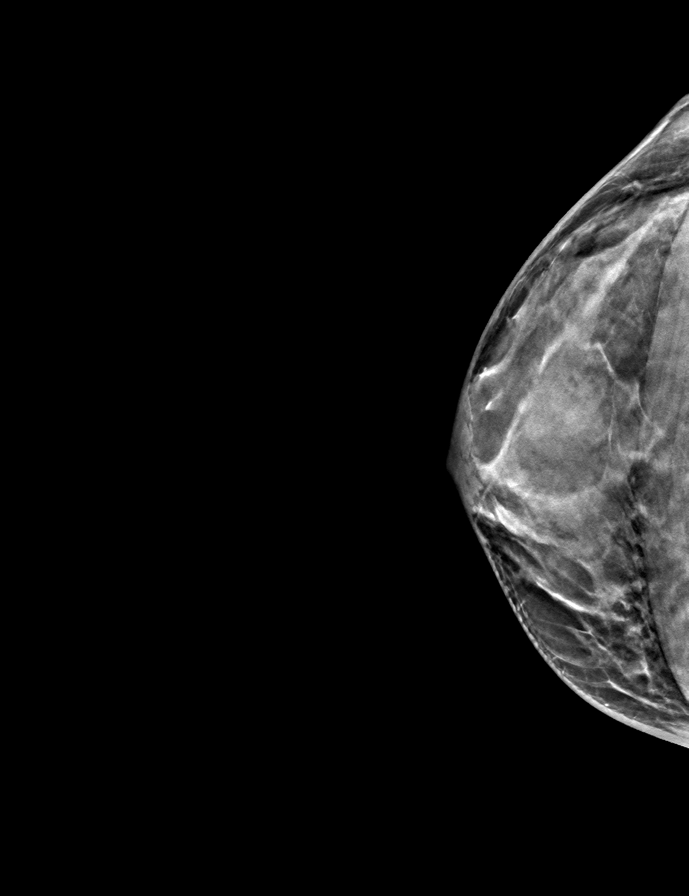

[9 of 24 positions shown; findings below may reference images not displayed]

ACR Breast Density Category d: The breast tissue is extremely dense,
which lowers the sensitivity of mammography
FINDINGS: There are no findings suspicious for malignancy.
IMPRESSION: No mammographic evidence of malignancy. A result letter of this
screening mammogram will be mailed directly to the patient.

RECOMMENDATION:
Screening mammogram in one year. (Code:[id number])

BI-RADS CATEGORY  1: Negative.

## 2022-02-18 ENCOUNTER — Ambulatory Visit
Admission: RE | Admit: 2022-02-18 | Discharge: 2022-02-18 | Disposition: A | Discharge status: Home / Self Care | Payer: 59 | Source: Ambulatory Visit

## 2022-02-18 DIAGNOSIS — Z1231 Encounter for screening mammogram for malignant neoplasm of breast: Secondary | ICD-10-CM | POA: Diagnosis not present

## 2022-03-02 ENCOUNTER — Other Ambulatory Visit (HOSPITAL_BASED_OUTPATIENT_CLINIC_OR_DEPARTMENT_OTHER): Payer: Self-pay

## 2022-03-03 ENCOUNTER — Other Ambulatory Visit (HOSPITAL_BASED_OUTPATIENT_CLINIC_OR_DEPARTMENT_OTHER): Payer: Self-pay

## 2022-03-05 ENCOUNTER — Other Ambulatory Visit (HOSPITAL_BASED_OUTPATIENT_CLINIC_OR_DEPARTMENT_OTHER): Payer: Self-pay

## 2022-03-08 ENCOUNTER — Ambulatory Visit: Payer: Self-pay

## 2022-03-08 ENCOUNTER — Ambulatory Visit (INDEPENDENT_AMBULATORY_CARE_PROVIDER_SITE_OTHER): Payer: 59 | Admitting: Family Medicine

## 2022-03-08 ENCOUNTER — Encounter: Payer: Self-pay | Admitting: Family Medicine

## 2022-03-08 VITALS — BP 118/68 | Ht 65.5 in | Wt 130.0 lb

## 2022-03-08 DIAGNOSIS — G8929 Other chronic pain: Secondary | ICD-10-CM

## 2022-03-08 DIAGNOSIS — M25512 Pain in left shoulder: Secondary | ICD-10-CM | POA: Diagnosis not present

## 2022-03-08 NOTE — Patient Instructions (Addendum)
You have proximal biceps tendinopathy. Activities and train as tolerated. Add rehab for this, continue ionto weekly for 4-6 weeks total, dry needling if you're getting benefit from this. Aleve 1-2 tabs twice a day with food for pain and inflammation - typically recommend taking for 1 week then as needed. Icing 15 minutes at a time after workouts. Consider shockwave therapy if not improving. Follow up in 6 weeks (or as needed).

## 2022-03-08 NOTE — Progress Notes (Signed)
  Gail Ford - 62 y.o. female MRN [id number]  Date of birth: 04-Jul-1960  HPI Pt complains of chronic L shoulder pain previously diagnosed as bursitis. Lateral shoulder tender, pt states she thinks it is her anterior deltoid and/or biceps tendon. This currently episode of pain started in Oct 2023. She is still able to swim and lift weights although it is sore during these activities. Pain is worse with abduction. 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   Had dry needling done a couple weeks ago. Stretches regularly. Iontophoreses patch about a week ago. Unsure if any of these have helped. Has not been taking any pain medications.   PHYSICAL EXAM:  VS: BP:118/68  HR: bpm  TEMP: ( )  RESP:   HT:5' 5.5" (166.4 cm)   WT:130 lb (59 kg)  BMI:21.3 PHYSICAL EXAM: Shoulder: Inspection reveals no obvious deformity, atrophy, or asymmetry. No bruising. No swelling Palpation is normal with no TTP over [facility] joint, very mild tenderness over bicipital groove. Full ROM in flexion, abduction, internal/external rotation NV intact distally Normal scapular function observed. Special Tests:  - Impingement: Neg Hawkins, neers - Supraspinatous: Negative empty can.  5/5 strength with resisted flexion at 20 degrees - Infraspinatous/Teres Minor: 5/5 strength with ER - Subscapularis: 5/5 strength with IR - Biceps tendon: Negative Speeds, Yerrgason's  - Labrum: Negative Obriens, good stability - AC Joint: Negative cross arm - Negative apprehension test - No drop arm sign   ULTRASOUND: Shoulder, L  Diagnostic ultrasound imaging obtained of patient's L shoulder.  - No obvious evidence of bony deformity or osteophyte development appreciated.  - Long head of the biceps tendon: No evidence of tendon thickening, calcification, subluxation, or tearing in short or long axis views. No  bullseye sign. Some hypoechogenicity of biceps  tendon - Subscapularis tendon: complete visualization across the width of the insertion point yielded no evidence of tendon thickening, calcification, or tears in the long axis view.  - Supraspinatus tendon: complete visualization across the width of the insertion point yielded no evidence of tendon thickening, calcification, or tears in the long axis view. No evidence of bursal inflammation appreciated.  -small ~ 2 mm supraspinatus tear - Infraspinatus and teres minor tendons: visualization across the width of the insertion points yielded no evidence of tendon thickening, calcification, or tears in the long axis view.  [facility] Joint: No evidence of joint separation, collapse, or osteophyte development appreciated. No effusion present.  IMPRESSION: findings consistent with biceps tendonopathy    ASSESSMENT & PLAN:   L Proximal Biceps Tendonopathy Hx and location of pain most consistent with biceps tendonitis. Small supraspinatus tear incidentally found on [place] and not likely cause of pain.  -Can use Aleve BID for pain -Can continue ionophoresis patches weekly for about 6 weeks  -Can continue dry needling  -Provided with home exercises to strengthen rotator cuff and biceps tendon  Precious Gilding, DO Family Medicine Resident, PGY-2

## 2022-03-09 ENCOUNTER — Other Ambulatory Visit (HOSPITAL_BASED_OUTPATIENT_CLINIC_OR_DEPARTMENT_OTHER): Payer: Self-pay

## 2022-03-09 ENCOUNTER — Encounter: Payer: Self-pay | Admitting: Family Medicine

## 2022-03-09 MED ORDER — LATANOPROST 0.005 % OP SOLN
1.0000 [drp] | Freq: Every evening | OPHTHALMIC | 11 refills | Status: DC
  Filled 2022-03-09: qty 2.5, 25d supply, fill #0
  Filled 2022-04-13: qty 2.5, 25d supply, fill #1
  Filled 2022-05-11: qty 2.5, 25d supply, fill #2
  Filled 2022-08-23: qty 2.5, 25d supply, fill #3

## 2022-04-13 ENCOUNTER — Other Ambulatory Visit: Payer: Self-pay

## 2022-04-13 ENCOUNTER — Other Ambulatory Visit (HOSPITAL_BASED_OUTPATIENT_CLINIC_OR_DEPARTMENT_OTHER): Payer: Self-pay

## 2022-04-13 MED ORDER — FLUOXETINE HCL 10 MG PO CAPS
10.0000 mg | ORAL_CAPSULE | Freq: Every morning | ORAL | 0 refills | Status: DC
  Filled 2022-04-13: qty 30, 30d supply, fill #0

## 2022-05-11 ENCOUNTER — Other Ambulatory Visit: Payer: Self-pay

## 2022-05-11 ENCOUNTER — Other Ambulatory Visit (HOSPITAL_BASED_OUTPATIENT_CLINIC_OR_DEPARTMENT_OTHER): Payer: Self-pay

## 2022-05-13 ENCOUNTER — Other Ambulatory Visit (HOSPITAL_BASED_OUTPATIENT_CLINIC_OR_DEPARTMENT_OTHER): Payer: Self-pay

## 2022-05-13 MED ORDER — FLUOXETINE HCL 10 MG PO CAPS
10.0000 mg | ORAL_CAPSULE | Freq: Every morning | ORAL | 0 refills | Status: DC
  Filled 2022-05-13: qty 15, 15d supply, fill #0

## 2022-06-23 ENCOUNTER — Ambulatory Visit: Payer: PRIVATE HEALTH INSURANCE | Admitting: Nurse Practitioner

## 2022-06-25 ENCOUNTER — Other Ambulatory Visit (HOSPITAL_BASED_OUTPATIENT_CLINIC_OR_DEPARTMENT_OTHER): Payer: Self-pay

## 2022-06-25 DIAGNOSIS — E2839 Other primary ovarian failure: Secondary | ICD-10-CM | POA: Diagnosis not present

## 2022-06-25 DIAGNOSIS — Z0000 Encounter for general adult medical examination without abnormal findings: Secondary | ICD-10-CM | POA: Diagnosis not present

## 2022-06-25 DIAGNOSIS — F411 Generalized anxiety disorder: Secondary | ICD-10-CM | POA: Diagnosis not present

## 2022-06-25 DIAGNOSIS — E782 Mixed hyperlipidemia: Secondary | ICD-10-CM | POA: Diagnosis not present

## 2022-06-25 MED ORDER — FLUOXETINE HCL 10 MG PO CAPS
10.0000 mg | ORAL_CAPSULE | Freq: Every morning | ORAL | 1 refills | Status: DC
  Filled 2022-06-25: qty 90, 90d supply, fill #0
  Filled 2022-10-01: qty 90, 90d supply, fill #1

## 2022-07-01 ENCOUNTER — Other Ambulatory Visit: Payer: Self-pay | Admitting: Physician Assistant

## 2022-07-01 DIAGNOSIS — E2839 Other primary ovarian failure: Secondary | ICD-10-CM

## 2022-07-28 ENCOUNTER — Other Ambulatory Visit (HOSPITAL_BASED_OUTPATIENT_CLINIC_OR_DEPARTMENT_OTHER): Payer: Self-pay

## 2022-07-28 DIAGNOSIS — H40053 Ocular hypertension, bilateral: Secondary | ICD-10-CM | POA: Diagnosis not present

## 2022-07-28 MED ORDER — LATANOPROST 0.005 % OP SOLN
1.0000 [drp] | Freq: Every evening | OPHTHALMIC | 11 refills | Status: AC
  Filled 2022-07-28 – 2022-09-14 (×2): qty 2.5, 50d supply, fill #0
  Filled 2022-09-14: qty 2.5, 25d supply, fill #0
  Filled 2022-11-12: qty 2.5, 25d supply, fill #1
  Filled 2022-12-14: qty 2.5, 25d supply, fill #2
  Filled 2023-01-17: qty 2.5, 25d supply, fill #3
  Filled 2023-02-14: qty 2.5, 25d supply, fill #4

## 2022-08-10 ENCOUNTER — Other Ambulatory Visit (HOSPITAL_BASED_OUTPATIENT_CLINIC_OR_DEPARTMENT_OTHER): Payer: Self-pay

## 2022-08-17 ENCOUNTER — Ambulatory Visit: Admission: RE | Admit: 2022-08-17 | Payer: 59 | Source: Ambulatory Visit

## 2022-08-17 DIAGNOSIS — N958 Other specified menopausal and perimenopausal disorders: Secondary | ICD-10-CM | POA: Diagnosis not present

## 2022-08-17 DIAGNOSIS — E2839 Other primary ovarian failure: Secondary | ICD-10-CM

## 2022-08-17 DIAGNOSIS — E349 Endocrine disorder, unspecified: Secondary | ICD-10-CM | POA: Diagnosis not present

## 2022-08-23 ENCOUNTER — Other Ambulatory Visit (HOSPITAL_BASED_OUTPATIENT_CLINIC_OR_DEPARTMENT_OTHER): Payer: Self-pay

## 2022-08-24 NOTE — Progress Notes (Unsigned)
Gail Ford 1960-06-25 [id number]   History:  62 y.o. O1H0865 presents for annual exam. Complains of pain with intercourse. Using OTC lubricant, unsure of what kind. Issues with bowel incontinence while running. Gets a sense of urgency and has to run to the bathroom immediately. Stool is loose. Otherwise her bowel movements are normal, feels like the fully empties. No abdominal pain. Has been drinking warm water prior to running and this is a new change. Postmenopausal - stopped HRT in 2021. Normal pap and mammogram history. Requesting preventative labs today.   Gynecologic History No LMP recorded (lmp unknown). Patient is postmenopausal.   Contraception/Family planning: post menopausal status Sexually active: Yes  Health Maintenance Last Pap: 06/20/2018. Results were: Normal neg HPV, 5-year repeat Last mammogram: 02/18/2022. Results were: Normal Last colonoscopy: 2014. Results were: Normal per patient Last Dexa: 08/17/2022. Results were: Normal  Past medical history, past surgical history, family history and social history were all reviewed and documented in the EPIC chart. Married. Physical therapist. Triathlete. 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   ROS:  A ROS was performed and pertinent positives and negatives are included.  Exam:  Vitals:   08/25/22 0825  BP: 100/62  Pulse: 67  SpO2: 100%  Weight: 127 lb (57.6 kg)  Height: 5' 4.5" (1.638 m)    Body mass index is 21.46 kg/m.  General appearance:  Normal Thyroid:  Symmetrical, normal in size, without palpable masses or nodularity. Respiratory  Auscultation:  Clear without wheezing or rhonchi Cardiovascular  Auscultation:  Regular rate, without rubs, murmurs or gallops  Edema/varicosities:  Not grossly evident Abdominal  Soft,nontender, without masses, guarding or rebound.  Liver/spleen:  No organomegaly noted  Hernia:  None appreciated  Skin  Inspection:  Grossly normal Breasts: Examined lying and  sitting.   Right: Without masses, retractions, nipple discharge or axillary adenopathy.   Left: Without masses, retractions, nipple discharge or axillary adenopathy. Genitourinary   Inguinal/mons:  Normal without inguinal adenopathy  External genitalia:  Normal appearing vulva with no masses, tenderness, or lesions  BUS/Urethra/Skene's glands:  Normal  Vagina:  Normal appearing with normal color and discharge, no lesions  Cervix:  Normal appearing without discharge or lesions  Uterus:  Normal in size, shape and contour.  Midline and mobile, nontender  Adnexa/parametria:     Rt: Normal in size, without masses or tenderness.   Lt: Normal in size, without masses or tenderness.  Anus and perineum: Normal, non-bleeding hemorrhoids present  Digital rectal exam: Normal sphincter tone  Patient informed chaperone available to be present for breast and pelvic exam. Patient has requested no chaperone to be present. Patient has been advised what will be completed during breast and pelvic exam.   Assessment/Plan:  62 y.o. [id number] for annual exam.   Well female exam with routine gynecological exam - Plan: CBC with Differential/Platelet, Comprehensive metabolic panel, TSH. Education provided on SBEs, importance of preventative screenings, current guidelines, high calcium diet, regular exercise, and multivitamin daily.   Postmenopausal - stopped HRT in 2021. She has occasional hot flashes. No bleeding.   Hyperlipidemia, unspecified hyperlipidemia type - Plan: Lipid panel  Incontinence of feces with fecal urgency - See HPI. Normal rectal exam. Recommend discontinuing warm water prior to running as this seems to be the change that occurred at the same time as her symptoms. Increase fiber intake. If no improvement with changes option for GI referral or pelvic floor PT.  Vaginal atrophy - Plan: estradiol (ESTRACE VAGINAL) 0.1 MG/GM vaginal  cream twice weekly. Aware of risk for small amount of systemic  absorption. Will use initially nightly x 2 weeks then twice weekly. Recommend oil or silicone lubricants.   Screening for cervical cancer - Normal Pap history.  Will repeat at 5-year interval per guidelines.  Screening for breast cancer - Normal mammogram history.  Continue annual screenings.  Normal breast exam today.  Screening for colon cancer - Normal colonoscopy at age 74.  Due now and plans to schedule.   Screening for osteoporosis - Mother has osteoporosis. Normal DXA recently. She is very active, triathlete.   Return in 1 year for annual.     Olivia Mackie DNP, 8:51 AM 08/25/2022

## 2022-08-25 ENCOUNTER — Ambulatory Visit (INDEPENDENT_AMBULATORY_CARE_PROVIDER_SITE_OTHER): Payer: 59 | Admitting: Nurse Practitioner

## 2022-08-25 ENCOUNTER — Other Ambulatory Visit (HOSPITAL_BASED_OUTPATIENT_CLINIC_OR_DEPARTMENT_OTHER): Payer: Self-pay

## 2022-08-25 VITALS — BP 100/62 | HR 67 | Ht 64.5 in | Wt 127.0 lb

## 2022-08-25 DIAGNOSIS — Z780 Asymptomatic menopausal state: Secondary | ICD-10-CM

## 2022-08-25 DIAGNOSIS — E785 Hyperlipidemia, unspecified: Secondary | ICD-10-CM

## 2022-08-25 DIAGNOSIS — N952 Postmenopausal atrophic vaginitis: Secondary | ICD-10-CM

## 2022-08-25 DIAGNOSIS — R159 Full incontinence of feces: Secondary | ICD-10-CM | POA: Diagnosis not present

## 2022-08-25 DIAGNOSIS — Z01419 Encounter for gynecological examination (general) (routine) without abnormal findings: Secondary | ICD-10-CM | POA: Diagnosis not present

## 2022-08-25 LAB — CBC WITH DIFFERENTIAL/PLATELET
Absolute Monocytes: 450 cells/uL (ref 200–950)
Basophils Absolute: 30 cells/uL (ref 0–200)
HCT: 39.2 % (ref 35.0–45.0)
Hemoglobin: 12.9 g/dL (ref 11.7–15.5)
MCH: 31.9 pg (ref 27.0–33.0)
MCHC: 32.9 g/dL (ref 32.0–36.0)
MCV: 96.8 fL (ref 80.0–100.0)
Neutro Abs: 3726 cells/uL (ref 1500–7800)
Platelets: 276 10*3/uL (ref 140–400)
RBC: 4.05 10*6/uL (ref 3.80–5.10)
Total Lymphocyte: 29.1 %
WBC: 6 10*3/uL (ref 3.8–10.8)

## 2022-08-25 MED ORDER — ESTRADIOL 0.1 MG/GM VA CREA
1.0000 g | TOPICAL_CREAM | VAGINAL | 2 refills | Status: DC
  Filled 2022-08-25: qty 42.5, 57d supply, fill #0
  Filled 2023-01-17: qty 42.5, 57d supply, fill #1
  Filled 2023-02-14 – 2023-02-16 (×2): qty 42.5, 57d supply, fill #2

## 2022-08-26 ENCOUNTER — Encounter: Payer: Self-pay | Admitting: Nurse Practitioner

## 2022-08-26 LAB — COMPREHENSIVE METABOLIC PANEL
AG Ratio: 2 (calc) (ref 1.0–2.5)
ALT: 38 U/L — ABNORMAL HIGH (ref 6–29)
Alkaline phosphatase (APISO): 70 U/L (ref 37–153)
BUN/Creatinine Ratio: 22 (calc) (ref 6–22)
BUN: 24 mg/dL (ref 7–25)
CO2: 29 mmol/L (ref 20–32)
Chloride: 102 mmol/L (ref 98–110)
Creat: 1.09 mg/dL — ABNORMAL HIGH (ref 0.50–1.05)
Globulin: 2.4 g/dL (calc) (ref 1.9–3.7)
Glucose, Bld: 86 mg/dL (ref 65–99)
Potassium: 4 mmol/L (ref 3.5–5.3)
Sodium: 139 mmol/L (ref 135–146)
Total Protein: 7.1 g/dL (ref 6.1–8.1)

## 2022-08-26 LAB — LIPID PANEL
Cholesterol: 224 mg/dL — ABNORMAL HIGH (ref <200)
HDL: 82 mg/dL (ref >=50)
LDL Cholesterol (Calc): 126 mg/dL (calc) — ABNORMAL HIGH
Non-HDL Cholesterol (Calc): 142 mg/dL (calc) — ABNORMAL HIGH (ref <130)
Triglycerides: 68 mg/dL (ref <150)

## 2022-08-26 LAB — CBC WITH DIFFERENTIAL/PLATELET
Basophils Relative: 0.5 %
Eosinophils Absolute: 48 cells/uL (ref 15–500)
Eosinophils Relative: 0.8 %
Lymphs Abs: 1746 cells/uL (ref 850–3900)
MPV: 10.2 fL (ref 7.5–12.5)
Monocytes Relative: 7.5 %
Neutrophils Relative %: 62.1 %
RDW: 12.6 % (ref 11.0–15.0)

## 2022-08-26 NOTE — Telephone Encounter (Signed)
Routing to provider for final review and closing.

## 2022-09-14 ENCOUNTER — Other Ambulatory Visit (HOSPITAL_BASED_OUTPATIENT_CLINIC_OR_DEPARTMENT_OTHER): Payer: Self-pay

## 2022-12-14 ENCOUNTER — Other Ambulatory Visit: Payer: Self-pay

## 2022-12-14 ENCOUNTER — Other Ambulatory Visit (HOSPITAL_BASED_OUTPATIENT_CLINIC_OR_DEPARTMENT_OTHER): Payer: Self-pay

## 2022-12-14 MED ORDER — FLUOXETINE HCL 10 MG PO CAPS
10.0000 mg | ORAL_CAPSULE | Freq: Every morning | ORAL | 1 refills | Status: AC
  Filled 2022-12-14: qty 90, 90d supply, fill #0

## 2022-12-16 ENCOUNTER — Other Ambulatory Visit (HOSPITAL_BASED_OUTPATIENT_CLINIC_OR_DEPARTMENT_OTHER): Payer: Self-pay

## 2022-12-16 DIAGNOSIS — L03012 Cellulitis of left finger: Secondary | ICD-10-CM | POA: Diagnosis not present

## 2023-01-07 ENCOUNTER — Other Ambulatory Visit: Payer: Self-pay | Admitting: Nurse Practitioner

## 2023-01-07 ENCOUNTER — Other Ambulatory Visit: Payer: Self-pay | Admitting: Radiation Oncology

## 2023-01-07 DIAGNOSIS — Z1231 Encounter for screening mammogram for malignant neoplasm of breast: Secondary | ICD-10-CM

## 2023-01-12 ENCOUNTER — Other Ambulatory Visit (HOSPITAL_BASED_OUTPATIENT_CLINIC_OR_DEPARTMENT_OTHER): Payer: Self-pay

## 2023-02-15 ENCOUNTER — Other Ambulatory Visit (HOSPITAL_BASED_OUTPATIENT_CLINIC_OR_DEPARTMENT_OTHER): Payer: Self-pay

## 2023-02-16 ENCOUNTER — Other Ambulatory Visit (HOSPITAL_BASED_OUTPATIENT_CLINIC_OR_DEPARTMENT_OTHER): Payer: Self-pay

## 2023-02-16 ENCOUNTER — Encounter: Payer: Self-pay | Admitting: Nurse Practitioner

## 2023-02-16 ENCOUNTER — Encounter (HOSPITAL_BASED_OUTPATIENT_CLINIC_OR_DEPARTMENT_OTHER): Payer: Self-pay

## 2023-02-23 ENCOUNTER — Ambulatory Visit
Admission: RE | Admit: 2023-02-23 | Discharge: 2023-02-23 | Disposition: A | Discharge status: Home / Self Care | Payer: 59 | Source: Ambulatory Visit

## 2023-02-23 DIAGNOSIS — Z1231 Encounter for screening mammogram for malignant neoplasm of breast: Secondary | ICD-10-CM | POA: Diagnosis not present

## 2023-03-24 ENCOUNTER — Other Ambulatory Visit (HOSPITAL_BASED_OUTPATIENT_CLINIC_OR_DEPARTMENT_OTHER): Payer: Self-pay

## 2023-04-14 ENCOUNTER — Other Ambulatory Visit (HOSPITAL_BASED_OUTPATIENT_CLINIC_OR_DEPARTMENT_OTHER): Payer: Self-pay

## 2023-09-06 ENCOUNTER — Ambulatory Visit: Admitting: Nurse Practitioner

## 2023-11-15 ENCOUNTER — Encounter: Payer: Self-pay | Admitting: Family Medicine

## 2023-11-23 ENCOUNTER — Ambulatory Visit (INDEPENDENT_AMBULATORY_CARE_PROVIDER_SITE_OTHER): Admitting: Family Medicine

## 2023-11-23 VITALS — BP 104/68 | Ht 65.5 in | Wt 127.0 lb

## 2023-11-23 DIAGNOSIS — M545 Low back pain, unspecified: Secondary | ICD-10-CM | POA: Diagnosis not present

## 2023-11-23 MED ORDER — METHOCARBAMOL 500 MG PO TABS: 500.0000 mg | ORAL_TABLET | Freq: Three times a day (TID) | ORAL | 0 refills | Status: DC | PRN

## 2023-11-23 MED ORDER — MELOXICAM 15 MG PO TABS: 15.0000 mg | ORAL_TABLET | Freq: Every day | ORAL | 1 refills | Status: DC

## 2023-11-23 NOTE — Patient Instructions (Signed)
 You have a lumbar strain. Take meloxicam 15mg  daily with food for pain and inflammation. Robaxin as needed for muscle spasms (no driving on this medicine if it makes you sleepy). Stay as active as possible. Do home exercises and stretches as directed - hold each for 20-30 seconds and do each one three times. Start physical therapy and do home exercises on days you don't go to therapy. Strengthening of low back muscles, abdominal musculature are key for long term pain relief. If not improving, will consider further imaging (x-rays). Follow up with me in 5-6 weeks but call sooner if you're struggling.

## 2023-11-24 ENCOUNTER — Encounter: Payer: Self-pay | Admitting: Family Medicine

## 2023-11-24 NOTE — Progress Notes (Signed)
 PCP: Patrisha Jerrell PARAS, PA-C (Inactive)  Patient is a 63 y.o. female here for low back pain.  HPI Patient denies known injury or trauma. For a few weeks now she's had pain in low back a little more to the left side. No radiation into lower extremities. No numbness or tingling. No bowel or bladder dysfunction. She contacted her physical therapist who recommended she get checked out first. No history of osteoporosis. She is a very active triathlete - not bothering her with running.  Past Medical History:  Diagnosis Date   Glaucoma    PVC (premature ventricular contraction) 1-13   SEEING CARDIOLOGIST    Current Outpatient Medications on File Prior to Visit  Medication Sig Dispense Refill   COVID-19 mRNA bivalent vaccine, Moderna, (MODERNA COVID-19 BIVAL BOOSTER) 50 MCG/0.5ML injection Inject into the muscle. 0.5 mL 0   estradiol  (ESTRACE  VAGINAL) 0.1 MG/GM vaginal cream Place 1 g vaginally 2 (two) times a week. Initial dose: nightly x 2 weeks, then twice weekly 42.5 g 2   FLUoxetine  (PROZAC ) 10 MG capsule Take 1 capsule (10 mg total) by mouth every morning. 90 capsule 1   FLUoxetine  (PROZAC ) 10 MG capsule Take 1 capsule (10 mg total) by mouth in the morning. 90 capsule 1   latanoprost  (XALATAN ) 0.005 % ophthalmic solution Place 1 drop into both eyes every evening. 2.5 mL 11   Multiple Vitamins-Minerals (MULTIVITAMIN PO) Take by mouth.     No current facility-administered medications on file prior to visit.    Past Surgical History:  Procedure Laterality Date   ELBOW SURGERY  1999   left olecranon ORIF    OLECRANON BURSA EXCISION  1999   LEFT    No Known Allergies  BP 104/68   Ht 5' 5.5 (1.664 m)   Wt 127 lb (57.6 kg)   LMP  (LMP Unknown)   BMI 20.81 kg/m       No data to display              No data to display              Objective:  Physical Exam:  Gen: NAD, comfortable in exam room  Back: No gross deformity, scoliosis. TTP minimally left  paraspinal lumbar region about L3 level.  No midline or bony TTP. FROM. Strength LEs 5/5 all muscle groups.   2+ MSRs in patellar and achilles tendons, equal bilaterally. Negative SLRs. Sensation intact to light touch bilaterally.  Assessment and Plan:  Low back pain - no red flag signs or symptoms.  Consistent with lumbar strain.  Meloxicam with robaxin if needed.  Start physical therapy and home exercise program.  Follow up in 5-6 weeks.  Consider x-rays if not improving.

## 2023-12-23 ENCOUNTER — Encounter: Payer: Self-pay | Admitting: Nurse Practitioner

## 2024-01-02 NOTE — Telephone Encounter (Signed)
 Routing to [facility] Appts to f/u with patient.

## 2024-01-04 ENCOUNTER — Ambulatory Visit: Admitting: Family Medicine

## 2024-01-04 VITALS — BP 124/82 | Ht 65.5 in | Wt 128.0 lb

## 2024-01-04 DIAGNOSIS — M79672 Pain in left foot: Secondary | ICD-10-CM | POA: Diagnosis not present

## 2024-01-04 DIAGNOSIS — M7741 Metatarsalgia, right foot: Secondary | ICD-10-CM | POA: Diagnosis not present

## 2024-01-04 DIAGNOSIS — M7742 Metatarsalgia, left foot: Secondary | ICD-10-CM | POA: Diagnosis not present

## 2024-01-04 DIAGNOSIS — M79671 Pain in right foot: Secondary | ICD-10-CM

## 2024-01-05 NOTE — Progress Notes (Signed)
 PCP: Patrisha Jerrell PARAS, PA-C (Inactive)  Patient is a 63 y.o. female here for custom orthotics.  HPI Gail Ford is doing well. Has had orthotics before but these are very old and worn. Old ones cracked - had metatarsal pads on top, metatarsal cushioning on bottom. No pain currently, would just like a replacement pair.  Past Medical History:  Diagnosis Date   Glaucoma    PVC (premature ventricular contraction) 1-13   SEEING CARDIOLOGIST    Current Outpatient Medications on File Prior to Visit  Medication Sig Dispense Refill   COVID-19 mRNA bivalent vaccine, Moderna, (MODERNA COVID-19 BIVAL BOOSTER) 50 MCG/0.5ML injection Inject into the muscle. 0.5 mL 0   estradiol  (ESTRACE  VAGINAL) 0.1 MG/GM vaginal cream Place 1 g vaginally 2 (two) times a week. Initial dose: nightly x 2 weeks, then twice weekly 42.5 g 2   FLUoxetine  (PROZAC ) 10 MG capsule Take 1 capsule (10 mg total) by mouth every morning. 90 capsule 1   FLUoxetine  (PROZAC ) 10 MG capsule Take 1 capsule (10 mg total) by mouth in the morning. 90 capsule 1   latanoprost  (XALATAN ) 0.005 % ophthalmic solution Place 1 drop into both eyes every evening. 2.5 mL 11   meloxicam  (MOBIC ) 15 MG tablet Take 1 tablet (15 mg total) by mouth daily. 30 tablet 1   methocarbamol  (ROBAXIN ) 500 MG tablet Take 1 tablet (500 mg total) by mouth every 8 (eight) hours as needed. 60 tablet 0   Multiple Vitamins-Minerals (MULTIVITAMIN PO) Take by mouth.     No current facility-administered medications on file prior to visit.    Past Surgical History:  Procedure Laterality Date   ELBOW SURGERY  1999   left olecranon ORIF    OLECRANON BURSA EXCISION  1999   LEFT    No Known Allergies  BP 124/82   Ht 5' 5.5 (1.664 m)   Wt 128 lb (58.1 kg)   LMP  (LMP Unknown)   BMI 20.98 kg/m       No data to display              No data to display              Objective:  Physical Exam:  Gen: NAD, comfortable in exam room  Bilateral  feet/ankles: Pes cavus.  Small bunionette, transverse arch breakdown with rotation of digits 3-5 bilaterally.  No callus over MT heads 2-4.  No other gross deformity, swelling, ecchymoses.  Left leg about 5mm shorter than right. Full range of motion No tenderness to palpation  NV intact distally.  Assessment and Plan:  Bilateral foot pain - history of metatarsalgia.  New custom orthotics made today as below.  Felt comfortable.  Follow up as needed.  Patient was fitted for a : standard, cushioned, semi-rigid orthotic. The orthotic was heated and afterward the patient stood on the orthotic blank positioned on the orthotic stand. The patient was positioned in subtalar neutral position and 10 degrees of ankle dorsiflexion in a weight bearing stance. After completion of molding, a stable base was applied to the orthotic blank. The blank was ground to a stable position for weight bearing. Size: 9 fit n run (cut to fit - would normally be size 8 but we were out of these today) Base: none - consider adding in future if needed. Posting: none Additional orthotic padding: small metatarsal pads bilaterally  Total visit time 20 minutes including documentation.

## 2024-01-06 ENCOUNTER — Encounter: Payer: Self-pay | Admitting: Nurse Practitioner

## 2024-01-06 ENCOUNTER — Other Ambulatory Visit (HOSPITAL_COMMUNITY)
Admission: RE | Admit: 2024-01-06 | Discharge: 2024-01-06 | Disposition: A | Source: Ambulatory Visit | Attending: Nurse Practitioner | Admitting: Nurse Practitioner

## 2024-01-06 ENCOUNTER — Ambulatory Visit: Admitting: Nurse Practitioner

## 2024-01-06 VITALS — BP 108/60 | HR 52 | Ht 64.75 in | Wt 133.0 lb

## 2024-01-06 DIAGNOSIS — Z1331 Encounter for screening for depression: Secondary | ICD-10-CM

## 2024-01-06 DIAGNOSIS — Z124 Encounter for screening for malignant neoplasm of cervix: Secondary | ICD-10-CM

## 2024-01-06 DIAGNOSIS — N898 Other specified noninflammatory disorders of vagina: Secondary | ICD-10-CM

## 2024-01-06 DIAGNOSIS — Z780 Asymptomatic menopausal state: Secondary | ICD-10-CM

## 2024-01-06 DIAGNOSIS — Z01419 Encounter for gynecological examination (general) (routine) without abnormal findings: Secondary | ICD-10-CM | POA: Diagnosis not present

## 2024-01-06 DIAGNOSIS — N941 Unspecified dyspareunia: Secondary | ICD-10-CM | POA: Diagnosis not present

## 2024-01-06 DIAGNOSIS — N952 Postmenopausal atrophic vaginitis: Secondary | ICD-10-CM | POA: Diagnosis not present

## 2024-01-06 LAB — WET PREP FOR TRICH, YEAST, CLUE

## 2024-01-06 NOTE — Progress Notes (Signed)
 Gail Ford March 03, 1960 [id number]   History:  63 y.o. H6E9987 presents for annual exam. Postmenopausal - stopped HRT in 2021. Using vaginal estrogen for painful intercourse with good management. Complains of intermittent odor without discharge or itching. Normal pap history.   Gynecologic History No LMP recorded (lmp unknown). Patient is postmenopausal.   Contraception/Family planning: post menopausal status Sexually active: Yes  Health Maintenance Last Pap: 06/20/2018. Results were: Normal neg HPV Last mammogram: 02/23/2023. Results were: Normal Last colonoscopy: 2014. Results were: Normal per patient Last Dexa: 08/17/2022. Results were: Normal     01/06/2024   11:28 AM  Depression screen PHQ 2/9  Decreased Interest 0  Down, Depressed, Hopeless 0  PHQ - 2 Score 0     Past medical history, past surgical history, family history and social history were all reviewed and documented in the EPIC chart. Married. 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 2 sons, 16 yo grandson. Mother with osteoporosis.   ROS:  A ROS was performed and pertinent positives and negatives are included.  Exam:  Vitals:   01/06/24 1123  BP: 108/60  Pulse: (!) 52  SpO2: 97%  Weight: 133 lb (60.3 kg)  Height: 5' 4.75 (1.645 m)     Body mass index is 22.3 kg/m.  General appearance:  Normal Thyroid:  Symmetrical, normal in size, without palpable masses or nodularity. Respiratory  Auscultation:  Clear without wheezing or rhonchi Cardiovascular  Auscultation:  Regular rate, without rubs, murmurs or gallops  Edema/varicosities:  Not grossly evident Abdominal  Soft,nontender, without masses, guarding or rebound.  Liver/spleen:  No organomegaly noted  Hernia:  None appreciated  Skin  Inspection:  Grossly normal Breasts: Examined lying and sitting.   Right: Without masses, retractions, nipple discharge or axillary adenopathy.   Left: Without masses, retractions, nipple discharge  or axillary adenopathy. Pelvic: External genitalia:  no lesions              Urethra:  normal appearing urethra with no masses, tenderness or lesions              Bartholins and Skenes: normal                 Vagina: normal appearing vagina with normal color and discharge, no lesions              Cervix: no lesions Bimanual Exam:  Uterus:  no masses or tenderness              Adnexa: no mass, fullness, tenderness              Rectovaginal: Deferred              Anus:  normal, no lesions  Gail Ford, CMA present as chaperone.   Wet prep negative for pathogens  Assessment/Plan:  63 y.o. [id number] for annual exam.   Well female exam with routine gynecological exam - Education provided on SBEs, importance of preventative screenings, current guidelines, high calcium diet, regular exercise, and multivitamin daily. Had labs with PCP this week.   Depression screening - PHQ - 0  Postmenopausal - stopped HRT in 2021. She has occasional hot flashes. No bleeding.   Vaginal atrophy - Plan: estradiol  (ESTRACE  VAGINAL) 0.1 MG/GM vaginal cream twice weekly. Good management.   Cervical cancer screening - Plan: Cytology - PAP( [place]). Normal Pap history.  Pap today per guidelines.   Vaginal odor - Plan: WET PREP FOR TRICH, YEAST, CLUE. Negative wet prep.   Screening  for breast cancer - Normal mammogram history.  Continue annual screenings.  Normal breast exam today.  Screening for colon cancer - Normal colonoscopy at age 58.  Due now and plans to schedule.   Screening for osteoporosis - Mother has osteoporosis. Normal DXA in 2024. She is very active, triathlete.   Return in about 1 year (around 01/05/2025) for Annual.     Gail DELENA Shutter DNP, 11:45 AM 01/06/2024

## 2024-01-11 ENCOUNTER — Ambulatory Visit: Payer: Self-pay | Admitting: Nurse Practitioner

## 2024-01-11 LAB — CYTOLOGY - PAP
Comment: NEGATIVE
Diagnosis: NEGATIVE
Diagnosis: REACTIVE
High risk HPV: NEGATIVE

## 2024-02-03 ENCOUNTER — Encounter: Payer: Self-pay | Admitting: Nurse Practitioner

## 2024-02-03 ENCOUNTER — Other Ambulatory Visit: Payer: Self-pay

## 2024-02-03 MED ORDER — ESTRADIOL 0.01 % VA CREA: 1.0000 g | TOPICAL_CREAM | VAGINAL | 3 refills | Status: AC

## 2024-02-03 NOTE — Telephone Encounter (Signed)
 Med refill request: Estrace    Last AEX: 01/06/24 Next AEX: n/a Last MMG (if hormonal med) 02/23/23 birads cat 1 neg  Refill authorized: Last rx 08/26/22 42.5g with 2 refills. Please Advise?

## 2024-02-28 ENCOUNTER — Encounter: Payer: Self-pay | Admitting: Cardiology

## 2024-02-28 ENCOUNTER — Ambulatory Visit: Admitting: Cardiology

## 2024-02-28 VITALS — BP 104/68 | HR 55 | Ht 64.75 in | Wt 132.0 lb

## 2024-02-28 DIAGNOSIS — R001 Bradycardia, unspecified: Secondary | ICD-10-CM

## 2024-02-28 NOTE — Progress Notes (Signed)
" °  Electrophysiology Office Note:   Date:  02/28/2024  ID:  Gail Ford, DOB 01-14-1961, MRN [id number]  Primary Cardiologist: None Primary Heart Failure: None Electrophysiologist: None      History of Present Illness:   Gail Ford is a 64 y.o. female with h/o bradycardia, family history of amyloidosis, fatigue seen today for  for Electrophysiology evaluation of fatigue at the request of Vernell Fort.    She has always been bradycardic.  She ran an Ironman triathlon September 2025.  She continues to exercise regularly.  Resting heart rate is typically in the high 30s.  Heart rate has been higher coinciding with her increased fatigue.  She does have a family history of cardiac amyloidosis in her father.  Discussed the use of AI scribe software for clinical note transcription with the patient, who gave verbal consent to proceed.  History of Present Illness Gail Ford is a 64 year old female who presents for evaluation of low heart rate.  She was advised to have her heart rate checked after a routine annual examination revealed a very low heart rate. She is an active triathlete and feels fine without any symptoms or issues. No fatigue, shortness of breath, or swelling.  Her family history is significant for her father having heart issues requiring a pacemaker around her age. He was active and hiked the [place] with the pacemaker. Additionally, her father had amyloidosis, which eventually led to heart failure, but he was older when diagnosed.  She mentions having high cholesterol.    Review of systems complete and found to be negative unless listed in HPI.   EP Information / Studies Reviewed:    EKG is ordered today. Personal review as below.        Physical Exam:   VS:  LMP  (LMP Unknown)    Wt Readings from Last 3 Encounters:  01/06/24 133 lb (60.3 kg)  01/04/24 128 lb (58.1 kg)  11/23/23 127 lb (57.6 kg)     GEN: Well nourished, well developed in no acute  distress NECK: No JVD; No carotid bruits CARDIAC: Regular rate and rhythm, no murmurs, rubs, gallops RESPIRATORY:  Clear to auscultation without rales, wheezing or rhonchi  ABDOMEN: Soft, non-tender, non-distended EXTREMITIES:  No edema; No deformity   Risk Assessment/Calculations:     ASSESSMENT AND PLAN:    1.  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  She has resting bradycardia, but is an endurance athlete and has run Ironman triathlons as recently as 6 months ago.  In review of her EKG, she has a mildly increased first-degree AV block, but otherwise intervals are completely normal.  As she is able to exercise without issue and does not complain of fatigue, weakness, shortness of breath, lower extremity edema, I do not feel that she needs any further workup.  If she develops heart failure symptoms, or significant fatigue with exercise, we would be happy to see her back.  Follow up with EP Team as needed   Signed, Reiana Poteet Gladis Norton, MD  "

## 2024-02-28 NOTE — Patient Instructions (Signed)
 Medication Instructions:  Your physician recommends that you continue on your current medications as directed. Please refer to the Current Medication list given to you today.  *If you need a refill on your cardiac medications before your next appointment, please call your pharmacy*   Follow-Up: At [facility], you and your health needs are our priority.  As part of our continuing mission to provide you with exceptional heart care, our providers are all part of one team.  This team includes your primary Cardiologist (physician) and Advanced Practice Providers or APPs (Physician Assistants and Nurse Practitioners) who all work together to provide you with the care you need, when you need it.  Your next appointment:   As needed with Dr. Lawana Pray

## 2024-03-05 ENCOUNTER — Encounter: Payer: Self-pay | Admitting: Gastroenterology
